# Patient Record
Sex: Female | Born: 1935 | Race: White | Hispanic: No | Smoking: Current some day smoker
Health system: Southern US, Community
[De-identification: ages and names within clinical notes are randomized; demographics above are authoritative.]

## PROBLEM LIST (undated history)

## (undated) DIAGNOSIS — I10 Essential (primary) hypertension: Secondary | ICD-10-CM

## (undated) DIAGNOSIS — R55 Syncope and collapse: Secondary | ICD-10-CM

## (undated) DIAGNOSIS — F32A Depression, unspecified: Secondary | ICD-10-CM

## (undated) DIAGNOSIS — E119 Type 2 diabetes mellitus without complications: Secondary | ICD-10-CM

## (undated) DIAGNOSIS — M1990 Unspecified osteoarthritis, unspecified site: Secondary | ICD-10-CM

## (undated) DIAGNOSIS — F329 Major depressive disorder, single episode, unspecified: Secondary | ICD-10-CM

## (undated) DIAGNOSIS — M545 Low back pain, unspecified: Secondary | ICD-10-CM

## (undated) DIAGNOSIS — R092 Respiratory arrest: Secondary | ICD-10-CM

## (undated) DIAGNOSIS — R928 Other abnormal and inconclusive findings on diagnostic imaging of breast: Secondary | ICD-10-CM

## (undated) DIAGNOSIS — G8929 Other chronic pain: Secondary | ICD-10-CM

## (undated) DIAGNOSIS — K5730 Diverticulosis of large intestine without perforation or abscess without bleeding: Secondary | ICD-10-CM

## (undated) DIAGNOSIS — I2510 Atherosclerotic heart disease of native coronary artery without angina pectoris: Secondary | ICD-10-CM

## (undated) DIAGNOSIS — Z8619 Personal history of other infectious and parasitic diseases: Secondary | ICD-10-CM

## (undated) DIAGNOSIS — K760 Fatty (change of) liver, not elsewhere classified: Secondary | ICD-10-CM

## (undated) DIAGNOSIS — E7800 Pure hypercholesterolemia, unspecified: Secondary | ICD-10-CM

## (undated) DIAGNOSIS — R569 Unspecified convulsions: Secondary | ICD-10-CM

## (undated) DIAGNOSIS — R0602 Shortness of breath: Secondary | ICD-10-CM

## (undated) DIAGNOSIS — D369 Benign neoplasm, unspecified site: Secondary | ICD-10-CM

## (undated) HISTORY — DX: Fatty (change of) liver, not elsewhere classified: K76.0

## (undated) HISTORY — PX: CARDIAC CATHETERIZATION: SHX172

## (undated) HISTORY — DX: Benign neoplasm, unspecified site: D36.9

## (undated) HISTORY — DX: Personal history of other infectious and parasitic diseases: Z86.19

## (undated) HISTORY — DX: Diverticulosis of large intestine without perforation or abscess without bleeding: K57.30

---

## 1978-06-26 HISTORY — PX: ABDOMINAL HYSTERECTOMY: SHX81

## 1978-06-26 HISTORY — PX: APPENDECTOMY: SHX54

## 1989-04-26 HISTORY — PX: ORIF HIP FRACTURE: SHX2125

## 1999-02-25 HISTORY — PX: CATARACT EXTRACTION W/ INTRAOCULAR LENS  IMPLANT, BILATERAL: SHX1307

## 2001-06-06 ENCOUNTER — Encounter: Payer: Self-pay | Admitting: Family Medicine

## 2001-06-06 ENCOUNTER — Ambulatory Visit (HOSPITAL_COMMUNITY): Admission: RE | Admit: 2001-06-06 | Discharge: 2001-06-06 | Payer: Self-pay | Admitting: Family Medicine

## 2003-05-07 ENCOUNTER — Ambulatory Visit (HOSPITAL_COMMUNITY): Admission: RE | Admit: 2003-05-07 | Discharge: 2003-05-07 | Payer: Self-pay | Admitting: Family Medicine

## 2003-06-27 DIAGNOSIS — Z8619 Personal history of other infectious and parasitic diseases: Secondary | ICD-10-CM

## 2003-06-27 HISTORY — DX: Personal history of other infectious and parasitic diseases: Z86.19

## 2003-07-13 DIAGNOSIS — D369 Benign neoplasm, unspecified site: Secondary | ICD-10-CM

## 2003-07-13 HISTORY — DX: Benign neoplasm, unspecified site: D36.9

## 2003-07-23 ENCOUNTER — Ambulatory Visit (HOSPITAL_COMMUNITY): Admission: RE | Admit: 2003-07-23 | Discharge: 2003-07-23 | Payer: Self-pay | Admitting: Internal Medicine

## 2003-07-23 HISTORY — PX: COLONOSCOPY: SHX174

## 2003-08-20 ENCOUNTER — Ambulatory Visit (HOSPITAL_COMMUNITY): Admission: RE | Admit: 2003-08-20 | Discharge: 2003-08-20 | Payer: Self-pay | Admitting: Cardiovascular Disease

## 2004-06-24 ENCOUNTER — Ambulatory Visit (HOSPITAL_COMMUNITY): Admission: RE | Admit: 2004-06-24 | Discharge: 2004-06-24 | Payer: Self-pay | Admitting: Internal Medicine

## 2004-08-19 ENCOUNTER — Ambulatory Visit (HOSPITAL_COMMUNITY): Admission: RE | Admit: 2004-08-19 | Discharge: 2004-08-19 | Payer: Self-pay | Admitting: Internal Medicine

## 2005-06-27 ENCOUNTER — Ambulatory Visit (HOSPITAL_COMMUNITY): Admission: RE | Admit: 2005-06-27 | Discharge: 2005-06-27 | Payer: Self-pay | Admitting: Internal Medicine

## 2005-08-04 ENCOUNTER — Emergency Department (HOSPITAL_COMMUNITY): Admission: EM | Admit: 2005-08-04 | Discharge: 2005-08-04 | Payer: Self-pay | Admitting: Emergency Medicine

## 2006-01-09 ENCOUNTER — Ambulatory Visit: Payer: Self-pay | Admitting: Gastroenterology

## 2006-01-21 ENCOUNTER — Emergency Department (HOSPITAL_COMMUNITY): Admission: EM | Admit: 2006-01-21 | Discharge: 2006-01-21 | Payer: Self-pay | Admitting: Emergency Medicine

## 2006-01-25 ENCOUNTER — Ambulatory Visit (HOSPITAL_COMMUNITY): Admission: RE | Admit: 2006-01-25 | Discharge: 2006-01-25 | Payer: Self-pay | Admitting: Internal Medicine

## 2006-07-03 ENCOUNTER — Ambulatory Visit (HOSPITAL_COMMUNITY): Admission: RE | Admit: 2006-07-03 | Discharge: 2006-07-03 | Payer: Self-pay | Admitting: Internal Medicine

## 2006-09-26 ENCOUNTER — Ambulatory Visit: Payer: Self-pay | Admitting: Internal Medicine

## 2006-09-26 ENCOUNTER — Ambulatory Visit (HOSPITAL_COMMUNITY): Admission: RE | Admit: 2006-09-26 | Discharge: 2006-09-26 | Payer: Self-pay | Admitting: Internal Medicine

## 2006-09-26 HISTORY — PX: COLONOSCOPY: SHX174

## 2006-10-18 ENCOUNTER — Inpatient Hospital Stay (HOSPITAL_COMMUNITY): Admission: AD | Admit: 2006-10-18 | Discharge: 2006-10-19 | Payer: Self-pay | Admitting: Cardiovascular Disease

## 2006-10-18 ENCOUNTER — Encounter: Payer: Self-pay | Admitting: Family Medicine

## 2007-03-17 ENCOUNTER — Emergency Department (HOSPITAL_COMMUNITY): Admission: EM | Admit: 2007-03-17 | Discharge: 2007-03-17 | Payer: Self-pay | Admitting: Emergency Medicine

## 2007-07-05 ENCOUNTER — Ambulatory Visit (HOSPITAL_COMMUNITY): Admission: RE | Admit: 2007-07-05 | Discharge: 2007-07-05 | Payer: Self-pay | Admitting: Internal Medicine

## 2007-07-11 ENCOUNTER — Ambulatory Visit (HOSPITAL_COMMUNITY): Admission: RE | Admit: 2007-07-11 | Discharge: 2007-07-11 | Payer: Self-pay | Admitting: Family Medicine

## 2008-01-30 ENCOUNTER — Emergency Department (HOSPITAL_COMMUNITY): Admission: EM | Admit: 2008-01-30 | Discharge: 2008-01-30 | Payer: Self-pay | Admitting: Emergency Medicine

## 2008-03-19 ENCOUNTER — Observation Stay (HOSPITAL_COMMUNITY): Admission: EM | Admit: 2008-03-19 | Discharge: 2008-03-22 | Payer: Self-pay | Admitting: Emergency Medicine

## 2008-04-18 ENCOUNTER — Emergency Department (HOSPITAL_COMMUNITY): Admission: EM | Admit: 2008-04-18 | Discharge: 2008-04-18 | Payer: Self-pay | Admitting: Emergency Medicine

## 2008-07-20 ENCOUNTER — Ambulatory Visit (HOSPITAL_COMMUNITY): Admission: RE | Admit: 2008-07-20 | Discharge: 2008-07-20 | Payer: Self-pay | Admitting: Family Medicine

## 2009-06-04 ENCOUNTER — Inpatient Hospital Stay (HOSPITAL_COMMUNITY): Admission: EM | Admit: 2009-06-04 | Discharge: 2009-06-06 | Payer: Self-pay | Admitting: Emergency Medicine

## 2009-08-27 ENCOUNTER — Ambulatory Visit (HOSPITAL_COMMUNITY): Admission: RE | Admit: 2009-08-27 | Discharge: 2009-08-27 | Payer: Self-pay | Admitting: Family Medicine

## 2010-06-02 ENCOUNTER — Emergency Department (HOSPITAL_COMMUNITY): Admission: EM | Admit: 2010-06-02 | Discharge: 2010-03-17 | Payer: Self-pay | Admitting: Emergency Medicine

## 2010-07-04 ENCOUNTER — Ambulatory Visit (HOSPITAL_COMMUNITY)
Admission: RE | Admit: 2010-07-04 | Discharge: 2010-07-04 | Payer: Self-pay | Source: Home / Self Care | Attending: Family Medicine | Admitting: Family Medicine

## 2010-07-17 ENCOUNTER — Encounter: Payer: Self-pay | Admitting: Family Medicine

## 2010-09-01 ENCOUNTER — Emergency Department (HOSPITAL_COMMUNITY)
Admission: EM | Admit: 2010-09-01 | Discharge: 2010-09-02 | Disposition: A | Discharge status: Home / Self Care | Payer: Medicare Other | Attending: Emergency Medicine | Admitting: Emergency Medicine

## 2010-09-01 DIAGNOSIS — E876 Hypokalemia: Secondary | ICD-10-CM | POA: Insufficient documentation

## 2010-09-01 DIAGNOSIS — E7800 Pure hypercholesterolemia, unspecified: Secondary | ICD-10-CM | POA: Insufficient documentation

## 2010-09-01 DIAGNOSIS — I10 Essential (primary) hypertension: Secondary | ICD-10-CM | POA: Insufficient documentation

## 2010-09-01 DIAGNOSIS — I2510 Atherosclerotic heart disease of native coronary artery without angina pectoris: Secondary | ICD-10-CM | POA: Insufficient documentation

## 2010-09-01 DIAGNOSIS — Z79899 Other long term (current) drug therapy: Secondary | ICD-10-CM | POA: Insufficient documentation

## 2010-09-01 DIAGNOSIS — G40909 Epilepsy, unspecified, not intractable, without status epilepticus: Secondary | ICD-10-CM | POA: Insufficient documentation

## 2010-09-01 DIAGNOSIS — F411 Generalized anxiety disorder: Secondary | ICD-10-CM | POA: Insufficient documentation

## 2010-09-02 LAB — URINALYSIS, ROUTINE W REFLEX MICROSCOPIC
Ketones, ur: NEGATIVE mg/dL
Nitrite: NEGATIVE
pH: 6.5 (ref 5.0–8.0)

## 2010-09-02 LAB — POCT I-STAT, CHEM 8
Calcium, Ion: 0.96 mmol/L — ABNORMAL LOW (ref 1.12–1.32)
Chloride: 91 mEq/L — ABNORMAL LOW (ref 96–112)
HCT: 45 % (ref 36.0–46.0)
Potassium: 3 mEq/L — ABNORMAL LOW (ref 3.5–5.1)
Sodium: 129 mEq/L — ABNORMAL LOW (ref 135–145)

## 2010-09-08 LAB — DIFFERENTIAL
Eosinophils Absolute: 0.2 10*3/uL (ref 0.0–0.7)
Eosinophils Relative: 3 % (ref 0–5)
Lymphocytes Relative: 31 % (ref 12–46)
Lymphs Abs: 2.6 10*3/uL (ref 0.7–4.0)
Monocytes Absolute: 0.8 10*3/uL (ref 0.1–1.0)

## 2010-09-08 LAB — URINALYSIS, ROUTINE W REFLEX MICROSCOPIC
Bilirubin Urine: NEGATIVE
Ketones, ur: NEGATIVE mg/dL
Nitrite: NEGATIVE
Protein, ur: 30 mg/dL — AB
Urobilinogen, UA: 0.2 mg/dL (ref 0.0–1.0)

## 2010-09-08 LAB — URINE CULTURE
Colony Count: NO GROWTH
Culture: NO GROWTH

## 2010-09-08 LAB — CBC
HCT: 44.1 % (ref 36.0–46.0)
Platelets: 313 10*3/uL (ref 150–400)
RDW: 13.7 % (ref 11.5–15.5)
WBC: 8.5 10*3/uL (ref 4.0–10.5)

## 2010-09-08 LAB — LACTIC ACID, PLASMA: Lactic Acid, Venous: 2.4 mmol/L — ABNORMAL HIGH (ref 0.5–2.2)

## 2010-09-08 LAB — CULTURE, BLOOD (ROUTINE X 2)
Culture: NO GROWTH
Report Status: 9272011
Report Status: 9272011

## 2010-09-08 LAB — BASIC METABOLIC PANEL
BUN: 4 mg/dL — ABNORMAL LOW (ref 6–23)
Creatinine, Ser: 0.63 mg/dL (ref 0.4–1.2)
GFR calc non Af Amer: >60 mL/min (ref >60)
Potassium: 3.3 mEq/L — ABNORMAL LOW (ref 3.5–5.1)

## 2010-09-27 LAB — HEMOGLOBIN A1C: Hgb A1c MFr Bld: 6.1 % (ref 4.6–6.1)

## 2010-09-27 LAB — RAPID URINE DRUG SCREEN, HOSP PERFORMED
Amphetamines: NOT DETECTED
Benzodiazepines: NOT DETECTED
Cocaine: NOT DETECTED
Opiates: NOT DETECTED
Tetrahydrocannabinol: NOT DETECTED

## 2010-09-27 LAB — CBC
HCT: 37.8 % (ref 36.0–46.0)
HCT: 42.6 % (ref 36.0–46.0)
MCHC: 34.4 g/dL (ref 30.0–36.0)
Platelets: 299 10*3/uL (ref 150–400)
Platelets: 338 10*3/uL (ref 150–400)
RDW: 14.6 % (ref 11.5–15.5)
RDW: 14.8 % (ref 11.5–15.5)

## 2010-09-27 LAB — COMPREHENSIVE METABOLIC PANEL
Albumin: 4.2 g/dL (ref 3.5–5.2)
BUN: 6 mg/dL (ref 6–23)
Creatinine, Ser: 0.68 mg/dL (ref 0.4–1.2)
GFR calc Af Amer: >60 mL/min (ref >60)
Potassium: 3.4 mEq/L — ABNORMAL LOW (ref 3.5–5.1)
Total Protein: 8 g/dL (ref 6.0–8.3)

## 2010-09-27 LAB — GLUCOSE, CAPILLARY
Glucose-Capillary: 108 mg/dL — ABNORMAL HIGH (ref 70–99)
Glucose-Capillary: 109 mg/dL — ABNORMAL HIGH (ref 70–99)
Glucose-Capillary: 112 mg/dL — ABNORMAL HIGH (ref 70–99)
Glucose-Capillary: 129 mg/dL — ABNORMAL HIGH (ref 70–99)

## 2010-09-27 LAB — DIFFERENTIAL
Basophils Absolute: 0 10*3/uL (ref 0.0–0.1)
Basophils Relative: 1 % (ref 0–1)
Eosinophils Absolute: 0.2 10*3/uL (ref 0.0–0.7)
Eosinophils Relative: 3 % (ref 0–5)
Lymphocytes Relative: 24 % (ref 12–46)
Lymphocytes Relative: 33 % (ref 12–46)
Monocytes Absolute: 0.8 10*3/uL (ref 0.1–1.0)
Monocytes Relative: 11 % (ref 3–12)
Neutro Abs: 4.6 10*3/uL (ref 1.7–7.7)

## 2010-09-27 LAB — URINALYSIS, ROUTINE W REFLEX MICROSCOPIC
Nitrite: NEGATIVE
Protein, ur: 30 mg/dL — AB
Urobilinogen, UA: 1 mg/dL (ref 0.0–1.0)

## 2010-09-27 LAB — MAGNESIUM: Magnesium: 2 mg/dL (ref 1.5–2.5)

## 2010-09-27 LAB — BLOOD GAS, ARTERIAL
Acid-Base Excess: 4.3 mmol/L — ABNORMAL HIGH (ref 0.0–2.0)
Bicarbonate: 30.6 mEq/L — ABNORMAL HIGH (ref 20.0–24.0)
TCO2: 24.9 mmol/L (ref 0–100)
TCO2: 26.5 mmol/L (ref 0–100)
pCO2 arterial: 39.3 mmHg (ref 35.0–45.0)
pCO2 arterial: 41.7 mmHg (ref 35.0–45.0)
pH, Arterial: 7.479 — ABNORMAL HIGH (ref 7.350–7.400)
pO2, Arterial: 79.8 mmHg — ABNORMAL LOW (ref 80.0–100.0)

## 2010-09-27 LAB — BASIC METABOLIC PANEL
BUN: 4 mg/dL — ABNORMAL LOW (ref 6–23)
GFR calc non Af Amer: >60 mL/min (ref >60)
Glucose, Bld: 87 mg/dL (ref 70–99)
Potassium: 3 mEq/L — ABNORMAL LOW (ref 3.5–5.1)

## 2010-09-27 LAB — POTASSIUM: Potassium: 3.7 mEq/L (ref 3.5–5.1)

## 2010-09-27 LAB — URINE MICROSCOPIC-ADD ON

## 2010-11-08 NOTE — H&P (Signed)
[name], Maldonado NO.:  [id number]   MEDICAL RECORD NO.:  [id number]          PATIENT TYPE:  OBV   LOCATION:  A341                          FACILITY:  [facility]   PHYSICIAN:  Osvaldo Shipper, MD     DATE OF BIRTH:  1936-03-01   DATE OF ADMISSION:  03/19/2008  DATE OF DISCHARGE:  LH                              HISTORY & PHYSICAL   PRIMARY CARE PHYSICIAN:  Kirk Ruths, M.D.   CARDIOLOGIST:  Nicki Guadalajara, M.D. with [facility].   ADMISSION DIAGNOSES:  1. Chest pain possibly related to poorly controlled blood pressure.  2. Uncontrolled hypertension.  3. Possible noncompliance.  4. Type 2 diabetes.  5. Dyslipidemia.  6. Seizure disorder.  7. Left renal artery stenosis.   CHIEF COMPLAINT:  Chest pain, nausea and vomiting this afternoon.   HISTORY OF PRESENT ILLNESS:  The patient is a 75 year old Caucasian  female who has hypertension, type 2 diabetes, dyslipidemia who  unfortunately continues to smoke quite heavily who was in her usual  state of health until today when she started developing chest pain  located in the retrosternal area as well as in the left side of her  chest.  She is a very poor historian and is unable to describe her  symptoms appropriately.  She mostly describes this pain as a deep pain  possibly dull.  On and off.  She did have two episodes of emesis this  afternoon.  This evening she started developing chest pain about 6:30  and at that time she was brought into the ED.  She developed some  shortness of breath, no palpitations.  She also had a headache.  No  syncopal episode was present.  She does have a chronic cough.  No  relation of the pain to breathing or to cough.  She mentions that the  pain was 6/10 in intensity and is currently almost relieved.   Overall, not an adequate history from this individual.   MEDICATIONS AT HOME:  1. Actonel 5 mg once a day.  2. Hydrochlorothiazide 25 mg once a  day.  3. Metformin 250 mg b.i.d.  4. Alprazolam 0.5 mg at bedtime.  5. Metoprolol 50 mg b.i.d.  6. Colace 250 mg once a day as needed.  7. Meloxicam 7.5 mg once a day.  8. Simvastatin 40 mg daily.  9. Centrum Silver one tablet daily.  10.Folic acid 1 tablet daily.  11.Aspirin 81 mg daily.  12.Zyrtec as needed.  13.OsCal b.i.d.   ALLERGIES:  PENICILLIN.   PAST MEDICAL HISTORY:  Positive for:  1. Dyslipidemia.  2. Hypertension.  3. Seizure disorder.   She has never had an MI and never had any stent placement.  She did have  a cardiac catheterization 1-1/2 years ago which did show 50% ostial  narrowing in the first septal perforator of the LAD, but medical  management was recommended.  EF was normal at that time.  She mentions  that she had an episode of chest pain back in August as well and at that  time she was found  to be apparently hypertensive on presentation.  Blood  pressure was 211/90 at that time.   She tells me that she is supposed to undergo some kind of evaluation at  the [facility] in [place] for her renal artery  stenosis.  She also mentions that she has been noncompliant with her  medications in the past.  However, for the past 2 months her sister has  been administering these medications and the patient tells me that she  does take them on a daily basis.   PAST SURGICAL HISTORY:  1. Goiter surgery.  2. Hysterectomy.  3. Surgery for hip fractures and leg trauma.   SOCIAL HISTORY:  She lives alone in [place].  She smokes 2 packs of  cigarettes on a daily basis.  No alcohol use.  No illicit drug use.  She  is fairly independent with her daily activities.   FAMILY HISTORY:  Noncontributory at this time.   REVIEW OF SYSTEMS:  A 10-point review of systems was negative except as  mentioned in the HPI.   PHYSICAL EXAMINATION:  VITAL SIGNS:  Temperature 97.9, blood pressure  when she came in was 213/101, heart rate 70, respiratory rate  20,  saturations 100% on oxygen.  Blood pressure did improve to 128/60 with  intervention.  GENERAL:  This is an elderly white female in no distress at this time.  HEENT:  No pallor and no icterus.  Oral mucous membrane is moist.  No  oral lesions are noted.  NECK:  Soft and supple.  No thyromegaly is appreciated.  CARDIOVASCULAR:  S1 and S2 is normal and regular.  No murmurs  appreciated.  No S3 and S4.  No rubs and no bruits.  LUNGS:  Clear to auscultation bilaterally.  No wheezing, rales, or  rhonchi.  ABDOMEN:  Soft, nontender, and nondistended.  Bowel sounds are present.  No mass or organomegaly is appreciated.  EXTREMITIES:  Do not show any edema.  Peripheral pulses are palpable.  MUSCULOSKELETAL:  Unremarkable.  NEUROLOGY:  She is alert and oriented x3.  No focal neurological  deficits are present.   LABORATORY DATA:  Her CBC is unremarkable.  Her BMET shows a sodium of  128, potassium 3.2, chloride 85, bicarb 35, glucose 98, BUN 6,  creatinine 0.6.  Cardiac markers negative x2.  BNP is 33.  UA showed a  low specific gravity, otherwise unremarkable.   She had a chest x-ray which showed cardiomegaly and pulmonary vascular  prominence without anything acute, possible nodularity in the anterior  aspect of the first right rib was noted.  CT of the head was done which  did not show any significant findings other than mild nonspecific white  matter changes and partial opacification of the right sphenoid sinus air  cells was also noted.   EKG was done which shows sinus rhythm with normal axis.  There is  evidence for right bundle branch block.  Nonspecific T wave changes are  noted.  No acute ST changes are noted compared to an older EKG.  No  significant changes appreciated.   ASSESSMENT:  This is a 75 year old Caucasian female who has a history of  hypertension which is not well controlled possibly from noncompliance  who presents today with chest pain and was found to be  extremely  hypertensive on admission to the emergency department.  Chest pain is  possibly a result of the high blood pressure.  With a not so significant  cardiac catheterization a year  and a half ago, I would not suspect  coronary artery disease as a possibility here.  The pain is also fairly  atypical for pulmonary embolism.  She has had CT scans of her chest in  the past with similar pains which have been negative for pulmonary  embolism.   PLAN:  1. Chest pain.  We will observe her in the hospital and rule her out      for acute coronary syndrome.  Consult [facility] in      the morning to see what further tests they want to consider.      Continue with the aspirin and beta-blocker at this time.  Deep      venous thrombosis prophylaxis will be initiated.  EKGs will be      repeated and lipid profile will be checked.  2. Hypertension, poorly controlled.  We will resume all of her      antihypertensive agents.  Continue with the nitro paste for now.      The plan has been emphasized to the patient.  Depending on how her      blood pressures do overnight, additional medications may be      considered.  We will also hope cardiology will be able to recommend      something in this case.  I will hold off on the hydrochlorothiazide      because of the hyponatremia and hypokalemia for now. Renal artery      stenosis may be contributing as well. Outpatient work-up for this      is planned.  3. Hyponatremia and hypokalemia.  She had a couple of episodes of      emesis.  She is on diuretics.  These could be some of the reasons      for her electrolyte imbalances.  We will replace her potassium,      give her gentle normal saline and recheck these labs tomorrow.  4. Type 2 diabetes.  Continue Metformin.  Check CBGs q.a.c. and      nightly.  Put her on a sliding scale and check an HB A1C.  5. History of seizure disorder.  She is on Keppra.  The dose is      unknown.  I have asked  the family to call in with the proper dosage      so that we can restart this medication in the hospital.  6. She has a history of osteoporosis which is stable.  7. Deep venous thrombosis prophylaxis will be initiated.  8. Further management decisions will depend on results of further      testing and the patient's response to treatment.      Osvaldo Shipper, MD  Electronically Signed     GK/MEDQ  D:  03/19/2008  T:  03/20/2008  Job:  [id number]   cc:   Dani Gobble, MD  Fax: [phone]   Kirk Ruths, M.D.  Fax: [phone]

## 2010-11-08 NOTE — Group Therapy Note (Signed)
NAMEBRITNY, Shannon Maldonado              ACCOUNT NO.:  [id number]   MEDICAL RECORD NO.:  [id number]          PATIENT TYPE:  OBV   LOCATION:  A341                          FACILITY:  [facility]   PHYSICIAN:  Margaretmary Dys, M.D.DATE OF BIRTH:  Jun 19, 1936   DATE OF PROCEDURE:  03/21/2008  DATE OF DISCHARGE:                                 PROGRESS NOTE   SUBJECTIVE:  The patient feels much better.  She said the chest pain is  gone, so is nausea and vomiting.   The patient is looking forward to ambulating in the hallway today.  She  denies any abdominal pain.  She denies any diarrhea.   OBJECTIVE:  GENERAL:  Conscious, alert, comfortable, pleasant, not in  acute distress.  VITAL SIGNS:  Blood pressure is 132/84, pulse of 66, respirations 20,  temperature 97.9 degrees Fahrenheit, oxygen saturation was 93% on room  air.  HEENT:  Normocephalic, atraumatic.  Oral mucosa was dry.  No exudates  were noted.  NECK:  Supple.  No JVD, lymphadenopathy.  LUNGS:  Clear clinically.  Good air entry bilaterally.  HEART:  S1 and S2 regular.  No S3, S4,  gallops or rubs.  ABDOMEN:  Soft, nontender.  Bowel sounds positive.  No masses palpable.  EXTREMITIES:  Trace pitting pedal edema with no calf induration or  tenderness.  CNS:  Grossly intact.  No focal neurological deficits.   LABORATORY DATA/DIAGNOSTIC:  White blood count 6.4, hemoglobin of 13.4,  hematocrit 38.5, platelet count 343,000 with no left shift.  Sodium 135,  potassium 4.4, chloride of 99, CO2 was 33, glucose 91, BUN of 6,  creatinine was 0.64, calcium is 9.6.   ASSESSMENT/PLAN:  1. Chest pain, rule out myocardial infarction.  2. Poorly controlled hypertension with severe hypertension on      presentation to the emergency room.  3. Type 2 diabetes.  4. History of left renal artery stenosis, awaiting work-up.  5. Seizure disorder.  6. Dyslipidemia.  7. Noncompliance.   1. The patient has ruled out for myocardial infarction.  The  patient      remains on aspirin and beta-blocker.  I think we can discontinue      her Nitrol paste.  Twelve-lead EKG remains unchanged.  2. Hypertension.  The patient's blood pressure is actually improved on      resumption of her home medications.  I agree that it is entirely      possible that this is secondary to poor compliance.  The patient      also has history of left renal artery stenosis and this may have      been accounting for some of her blood pressure problems.  3. Hyponatremia and hypokalemia, likely secondary to be emesis and      diuretic therapy.  Patient is currently on gentle saline infusion      and her electrolytes are corrected.  4. Type 2 diabetes.  Continue current oral hypoglycemic agents with      metformin and also sliding scale.  5. History of seizure disorder.  Continue the patient on Keppra at  this time.   DISPOSITION:  Overall the patient remains fairly well with negative  cardiac enzymes.  Her chest pain may have been entirely related to her  poorly controlled hypertension, although she may also have some baseline  angina.  Will evaluate the patient and possibly obtain cardiology input  on Monday if the patient has a recurrence of chest pain.      Margaretmary Dys, M.D.  Electronically Signed     AM/MEDQ  D:  03/21/2008  T:  03/21/2008  Job:  [id number]
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
## 2010-11-11 NOTE — Op Note (Signed)
NAME:  Shannon Maldonado, Shannon Maldonado                        ACCOUNT NO.:  [id number]   MEDICAL RECORD NO.:  [id number]                   PATIENT TYPE:  AMB   LOCATION:  DAY                                  FACILITY:  [facility]   PHYSICIAN:  Shannon Maldonado, M.D.              DATE OF BIRTH:  1935-08-09   DATE OF PROCEDURE:  07/23/2003  DATE OF DISCHARGE:                                 OPERATIVE REPORT   PROCEDURE:  Colonoscopy with stool sampling, snare polypectomy with biopsy.   ENDOSCOPIST:  Shannon Maldonado, M.D.   INDICATIONS FOR PROCEDURE:  The patient is a 75 year old lady with a 36-month  history of 13-month history of postprandial abdominal cramps and diarrhea.  She has never had a colonoscopy.  No family history of colorectal neoplasia.  Colonoscopy is now being done in part for screening and in part to further  evaluate her altered bowel habits.  This approach has been discussed with  the patient at the length.  The potential risks, benefits, and alternatives  have been reviewed; questions answered.  She is agreeable.  Please see my  handwritten H&P for more information.   PROCEDURE NOTE:  O2 saturation, blood pressure, pulse and respirations were  monitored throughout the entire procedure.  Conscious sedation: Versed 3 mg  IV, Demerol 75 mg IV in divided doses.   INSTRUMENT:  Olympus video chip pediatric colonoscope.   FINDINGS:  A digital rectal exam reveals no abnormalities.   ENDOSCOPIC FINDINGS:  The prep was adequate.   RECTUM:  Examination of the rectal mucosa including a retroflex view of the  anal verge revealed no abnormalities.   COLON:  The colonic mucosa was surveyed from the rectosigmoid junction  through the left transverse and right colon to the area of the appendiceal  orifice, ileocecal valve, and cecum.  These structures were well seen and  photographed for the record.   From this level the scope was slowly withdrawn.  All previously mentioned  mucosal surfaces  were again seen. Stool residue was suctioned for  microbiology studies.  There was a 0.75 cm pedunculated polyp in the hepatic  flexure which was removed with snare cautery and recovered through the  scope.  There was a second adjacent 5 mg polyp which was cold snared and  recovered. There was also a 5 mm polyp at 35 cm which was cold snared and  recovered.  Two biopsies of the sigmoid colon were taken to evaluate further  for microscopic colitis.   The patient tolerated the procedure well and was reacted in endoscopy.   IMPRESSION:  1. Normal rectum.  2. Left-sided diverticula.  3. Polyps in the hepatic flexure and sigmoid at 35 cm resected as described     above.  4. Biopsies of the left colon to rule out microscopic colitis.   RECOMMENDATIONS:  1. No aspirin or arthritis medications for next 10 days.  2. Follow up  on path.  3. Diverticulosis literature provided to Shannon Maldonado.  4. Further recommendations to follow.      ___________________________________________                                            Shannon Maldonado, M.D.   RMR/MEDQ  D:  07/23/2003  T:  07/23/2003  Job:  [id number]   cc:   Shannon Maldonado, M.D.  [address] [place]urell Josephs. [place]  [place]  [place] [address]  Fax: [phone]   Shannon Maldonado, M.D.  [address]  [place]  [place] [address]  Fax: [phone]

## 2010-11-11 NOTE — Cardiovascular Report (Signed)
NAMEKAILEEN, BRONKEMA NO.:  [id number]   MEDICAL RECORD NO.:  [id number]          PATIENT TYPE:  INP   LOCATION:  2013                         FACILITY:  [facility]   PHYSICIAN:  Nicki Guadalajara, M.D.     DATE OF BIRTH:  14-Dec-1935   DATE OF PROCEDURE:  DATE OF DISCHARGE:                            CARDIAC CATHETERIZATION   INDICATIONS:  Ms. Grisell Bissette is a 75 year old female with a long  history of tobacco use for over 50 years.  She has a history of  hyperlipidemia.  Over 20 years ago, apparent cardiac catheterization  revealed normal coronary arteries.  She had recently developed episodes  of chest pain and also left shoulder discomfort.  She was seen at Dr.  Edison Simon office yesterday and was given nitroglycerin for her chest  pain.  She ultimately was transported to [facility] where she  was admitted.  Cardiac enzymes were negative.  Definitive cardiac  catheterization was recommended.   PROCEDURE:  After premedication with Valium 2 mg intravenously and  fentanyl 25 mg, the patient prepped and draped in the usual fashion.  Her right femoral artery was punctured anteriorly and a 5-French sheath  was inserted.  Diagnostic catheterization was done utilizing 5-French  Judkins for left and right coronary catheters.  A 5-French pigtail  catheter was used for left ventriculography as well as distal  aortography.  Hemostasis was obtained by direct manual pressure.  The  patient tolerated the procedure well.   Hemodynamic data:  Central aortic pressure was 135/71.  Left ventricular  pressure was 135/14.   Angiographic data:  There was evidence for mild coronary calcification.  The left main coronary artery had an upward takeoff and trifurcated into  an LAD, a ramus intermediate vessel and the left circumflex vessel.  Left main was normal.   The LAD gave rise to several diagonal vessels, proximal moderate-sized  first septal perforating artery and several  additional small septal  perforating arteries.  There is mild 50% ostial narrowing involving the  proximal septal perforating artery.   The ramus intermediate vessel was angiographically normal.   The circumflex vessel had mild 20% proximal narrowing and gave rise to  an additional marginal vessel.   The right coronary artery was a large caliber angiographically normal  dominant vessel.   RAO ventriculography revealed hyperdynamic LV function.  Ejection  fraction is at least 65%.  There were no focal segmental wall motion  abnormalities.  There was no mitral regurgitation.   Distal aortography revealed mildly tortuous aorta without significant  stenoses.  There was no evidence for renal artery stenosis.   IMPRESSION:  1. Normal to hyperdynamic left ventricular (LV) function.  2. Mild coronary calcification with 50% ostial narrowing involving the      first septal perforator of the left anterior descending (LAD) and      20% narrowing of the proximal circumflex.   RECOMMENDATIONS:  Medical therapy.  Suspect noncardiac chest pain.           ______________________________  Nicki Guadalajara, M.D.     TK/MEDQ  D:  10/19/2006  T:  10/19/2006  Job:  [id number]   cc:   Kirk Ruths, M.D.  Nicki Guadalajara, M.D.

## 2010-11-11 NOTE — Op Note (Signed)
Shannon Maldonado, DELROSARIO              ACCOUNT NO.:  [id number]   MEDICAL RECORD NO.:  [id number]          PATIENT TYPE:  AMB   LOCATION:  DAY                           FACILITY:  [facility]   PHYSICIAN:  R. Roetta Sessions, M.D. 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  She is here for surveillance.  She is not  having any other lower GI tract symptoms aside from constipation.  Colonoscopy is now being done.  This approach has been discussed with  the patient at length.  The potential risks, benefits, and alternatives  have been reviewed, questions answered.  She is agreeable.  Please see  the documentation in the medical record.   PROCEDURE NOTE:  O2 saturation, blood pressure, pulse, and respirations  are monitor throughout the entire procedure.   CONSCIOUS SEDATION:  Versed 3 mg IV Demerol 75 mg IV in divided doses.   INSTRUMENT:  Pentax video chip system.   FINDINGS:  A digital rectal exam revealed no abnormalities.  Endoscopic  findings:  The prep was adequate.  Examination of the colonic mucosa was  undertaken.  The scope was advanced from the rectosigmoid junction  through the left transverse, right colon, to the area of the appendiceal  orifice, ileocecal valve, and cecum.  These structures were well seen  and photographed for the record.  From this level, the scope was slowly  withdrawn.  All previously mentioned mucosal surfaces were, again, seen.  The patient was noted to have left-sided diverticula.  The remainder of  the colonic mucosa appeared normal.  The scope was pulled down into the  rectum, where a thorough examination of the rectal mucosa, including a  retroflexed view of the anal verge, demonstrated no abnormalities.   The  patient tolerated the procedure well; and was reacted in endoscopy.   IMPRESSION:  1. Normal rectum.  2. Left-sided diverticula.  3. The remainder of the colonic mucosa appeared normal.   RECOMMENDATIONS:  1. Diverticulosis literature provided to Ms. [place].  2. She should take a daily fiber supplement.  Would also recommend 17      grams of MiraLax at bedtime p.r.n. constipation.  3. Repeat colonoscopy in 5 years.      Jonathon Bellows, M.D.  Electronically Signed     RMR/MEDQ  D:  09/26/2006  T:  09/26/2006  Job:  [id number]   cc:   Madelin Rear. Sherwood Gambler, MD  Fax: [phone]

## 2011-03-24 LAB — DIFFERENTIAL
Eosinophils Absolute: 0.2
Lymphs Abs: 1.9
Monocytes Relative: 8
Neutrophils Relative %: 61

## 2011-03-24 LAB — BASIC METABOLIC PANEL
Chloride: 101
Creatinine, Ser: 0.7
GFR calc Af Amer: >60
Potassium: 3.5

## 2011-03-24 LAB — CBC
HCT: 42.2
MCV: 89
RBC: 4.75
WBC: 7

## 2011-03-24 LAB — URINALYSIS, ROUTINE W REFLEX MICROSCOPIC
Bilirubin Urine: NEGATIVE
Glucose, UA: NEGATIVE
Specific Gravity, Urine: 1.015
pH: 6.5

## 2011-03-24 LAB — URINE MICROSCOPIC-ADD ON

## 2011-03-27 LAB — DIFFERENTIAL
Basophils Absolute: 0
Basophils Absolute: 0
Basophils Relative: 0
Basophils Relative: 1
Eosinophils Absolute: 0.2
Eosinophils Absolute: 0.4
Eosinophils Relative: 3
Eosinophils Relative: 3
Eosinophils Relative: 3
Eosinophils Relative: 7 — ABNORMAL HIGH
Lymphocytes Relative: 32
Lymphocytes Relative: 39
Lymphs Abs: 3
Monocytes Absolute: 0.6
Monocytes Absolute: 0.7
Monocytes Absolute: 0.9
Monocytes Relative: 13 — ABNORMAL HIGH
Monocytes Relative: 8
Neutro Abs: 2.8
Neutro Abs: 4.4
Neutro Abs: 5.4
Neutrophils Relative %: 38 — ABNORMAL LOW

## 2011-03-27 LAB — GLUCOSE, CAPILLARY
Glucose-Capillary: 105 — ABNORMAL HIGH
Glucose-Capillary: 106 — ABNORMAL HIGH
Glucose-Capillary: 93
Glucose-Capillary: 95
Glucose-Capillary: 96

## 2011-03-27 LAB — CBC
HCT: 38.8
HCT: 39.8
HCT: 42.2
Hemoglobin: 14.5
MCHC: 34.3
MCHC: 34.5
MCV: 88.8
MCV: 88.9
MCV: 89.9
Platelets: 343
Platelets: 357
RBC: 4.43
RDW: 13.9
RDW: 14.1
RDW: 14.2
WBC: 9.5

## 2011-03-27 LAB — COMPREHENSIVE METABOLIC PANEL
AST: 24
Albumin: 3.6
BUN: 9
Chloride: 87 — ABNORMAL LOW
Creatinine, Ser: 0.72
GFR calc Af Amer: >60
Total Bilirubin: 0.7
Total Protein: 6.3

## 2011-03-27 LAB — BASIC METABOLIC PANEL
BUN: 6
BUN: 7
CO2: 29
CO2: 35 — ABNORMAL HIGH
Calcium: 9.6
Calcium: 9.7
Calcium: 9.8
Chloride: 100
Chloride: 85 — ABNORMAL LOW
Creatinine, Ser: 0.64
Creatinine, Ser: 0.66
GFR calc Af Amer: >60
GFR calc non Af Amer: >60
GFR calc non Af Amer: >60
Glucose, Bld: 111 — ABNORMAL HIGH
Glucose, Bld: 91
Glucose, Bld: 98
Sodium: 128 — ABNORMAL LOW
Sodium: 130 — ABNORMAL LOW

## 2011-03-27 LAB — HEPATIC FUNCTION PANEL
Alkaline Phosphatase: 53
Bilirubin, Direct: 0.1
Indirect Bilirubin: 0.5
Total Bilirubin: 0.6

## 2011-03-27 LAB — URINALYSIS, ROUTINE W REFLEX MICROSCOPIC
Bilirubin Urine: NEGATIVE
Ketones, ur: NEGATIVE
Nitrite: NEGATIVE
Protein, ur: NEGATIVE

## 2011-03-27 LAB — CARDIAC PANEL(CRET KIN+CKTOT+MB+TROPI)
CK, MB: 1.2
CK, MB: 1.4
Relative Index: INVALID
Relative Index: INVALID
Troponin I: 0.02
Troponin I: 0.02

## 2011-03-27 LAB — LIPASE, BLOOD: Lipase: 51

## 2011-03-27 LAB — MAGNESIUM: Magnesium: 1.8

## 2011-03-27 LAB — POCT CARDIAC MARKERS
Myoglobin, poc: 94.9
Troponin i, poc: <0.05

## 2011-03-27 LAB — LIPID PANEL
Triglycerides: 167 — ABNORMAL HIGH
VLDL: 33

## 2011-09-05 ENCOUNTER — Other Ambulatory Visit (HOSPITAL_COMMUNITY): Payer: Self-pay | Admitting: Family Medicine

## 2011-09-05 DIAGNOSIS — Z139 Encounter for screening, unspecified: Secondary | ICD-10-CM

## 2011-09-07 ENCOUNTER — Encounter: Payer: Self-pay | Admitting: Internal Medicine

## 2011-09-08 ENCOUNTER — Ambulatory Visit (HOSPITAL_COMMUNITY)
Admission: RE | Admit: 2011-09-08 | Discharge: 2011-09-08 | Disposition: A | Discharge status: Home / Self Care | Payer: Medicare Other | Source: Ambulatory Visit | Attending: Family Medicine | Admitting: Family Medicine

## 2011-09-08 DIAGNOSIS — Z139 Encounter for screening, unspecified: Secondary | ICD-10-CM

## 2011-09-08 DIAGNOSIS — Z13820 Encounter for screening for osteoporosis: Secondary | ICD-10-CM | POA: Insufficient documentation

## 2011-09-08 DIAGNOSIS — Z780 Asymptomatic menopausal state: Secondary | ICD-10-CM | POA: Insufficient documentation

## 2011-09-08 DIAGNOSIS — Z1231 Encounter for screening mammogram for malignant neoplasm of breast: Secondary | ICD-10-CM | POA: Insufficient documentation

## 2011-09-08 DIAGNOSIS — M899 Disorder of bone, unspecified: Secondary | ICD-10-CM | POA: Insufficient documentation

## 2011-09-08 DIAGNOSIS — F17200 Nicotine dependence, unspecified, uncomplicated: Secondary | ICD-10-CM | POA: Insufficient documentation

## 2011-10-25 ENCOUNTER — Encounter (HOSPITAL_COMMUNITY): Payer: Self-pay | Admitting: *Deleted

## 2011-10-25 ENCOUNTER — Emergency Department (HOSPITAL_COMMUNITY)
Admission: EM | Admit: 2011-10-25 | Discharge: 2011-10-25 | Disposition: A | Discharge status: Home / Self Care | Payer: Medicare Other | Attending: Emergency Medicine | Admitting: Emergency Medicine

## 2011-10-25 ENCOUNTER — Emergency Department (HOSPITAL_COMMUNITY): Payer: Medicare Other

## 2011-10-25 DIAGNOSIS — E789 Disorder of lipoprotein metabolism, unspecified: Secondary | ICD-10-CM | POA: Insufficient documentation

## 2011-10-25 DIAGNOSIS — I2510 Atherosclerotic heart disease of native coronary artery without angina pectoris: Secondary | ICD-10-CM | POA: Insufficient documentation

## 2011-10-25 DIAGNOSIS — Z79899 Other long term (current) drug therapy: Secondary | ICD-10-CM | POA: Insufficient documentation

## 2011-10-25 DIAGNOSIS — I10 Essential (primary) hypertension: Secondary | ICD-10-CM | POA: Insufficient documentation

## 2011-10-25 DIAGNOSIS — M25519 Pain in unspecified shoulder: Secondary | ICD-10-CM | POA: Insufficient documentation

## 2011-10-25 DIAGNOSIS — M79609 Pain in unspecified limb: Secondary | ICD-10-CM | POA: Insufficient documentation

## 2011-10-25 DIAGNOSIS — E119 Type 2 diabetes mellitus without complications: Secondary | ICD-10-CM | POA: Insufficient documentation

## 2011-10-25 DIAGNOSIS — M25512 Pain in left shoulder: Secondary | ICD-10-CM

## 2011-10-25 HISTORY — DX: Unspecified convulsions: R56.9

## 2011-10-25 HISTORY — DX: Essential (primary) hypertension: I10

## 2011-10-25 HISTORY — DX: Atherosclerotic heart disease of native coronary artery without angina pectoris: I25.10

## 2011-10-25 HISTORY — DX: Pure hypercholesterolemia, unspecified: E78.00

## 2011-10-25 MED ORDER — HYDROMORPHONE HCL PF 1 MG/ML IJ SOLN
1.0000 mg | Freq: Once | INTRAMUSCULAR | Status: AC
  Administered 2011-10-25: 1 mg via INTRAMUSCULAR

## 2011-10-25 MED ORDER — HYDROCODONE-ACETAMINOPHEN 5-325 MG PO TABS: 1.0000 | ORAL_TABLET | Freq: Four times a day (QID) | ORAL | Status: AC | PRN

## 2011-10-25 MED ORDER — HYDROMORPHONE HCL PF 1 MG/ML IJ SOLN
INTRAMUSCULAR | Status: AC
  Administered 2011-10-25: 1 mg via INTRAMUSCULAR
  Filled 2011-10-25: qty 1

## 2011-10-25 MED ORDER — DIAZEPAM 5 MG PO TABS
5.0000 mg | ORAL_TABLET | Freq: Once | ORAL | Status: AC
  Administered 2011-10-25: 5 mg via ORAL
  Filled 2011-10-25: qty 1

## 2011-10-25 NOTE — ED Notes (Addendum)
Sling applied to LUE.  Reports decreased pain after application.

## 2011-10-25 NOTE — ED Notes (Signed)
Left in c/o son for transport home; alert, in no distress; instructions/prescriptions reviewed and f/u information provided.  Verbalizes understanding.

## 2011-10-25 NOTE — ED Notes (Signed)
C/o LUE pain x 2 weeks; states onset of pain with movement that starts in shoulder and radiates down LUE.  Denies injury.

## 2011-10-25 NOTE — ED Provider Notes (Signed)
History     CSN: [id number]  Arrival date & time 10/25/11  0127   First MD Initiated Contact with Patient 10/25/11 0153      Chief Complaint  Patient presents with  . Arm Pain    LUE    (Consider location/radiation/quality/duration/timing/severity/associated sxs/prior treatment) The history is provided by the patient.   left shoulder pain. No injury. Has history of bursitis requiring injections and symptoms feel the same. Onset about 2 weeks ago. Pain worse with movement. Pain radiates from shoulder down arm. No numbness or tingling. No neck pain. No chest pain or shortness of breath. Patient has been taking ibuprofen at home with minimal and intermittent relief. She is followed by primary care physician and cardiology. She does not currently have an orthopedic surgeon. No fevers. No rash or redness.  Past Medical History  Diagnosis Date  . Seizures   . Hypertension   . High cholesterol   . Coronary artery disease   . Diabetes mellitus     Past Surgical History  Procedure Date  . Abdominal hysterectomy     No family history on file.  History  Substance Use Topics  . Smoking status: Never Smoker   . Smokeless tobacco: Not on file  . Alcohol Use: No    OB History    Grav Para Term Preterm Abortions TAB SAB Ect Mult Living                  Review of Systems  Constitutional: Negative for fever and chills.  HENT: Negative for neck pain and neck stiffness.   Eyes: Negative for pain.  Respiratory: Negative for shortness of breath.   Cardiovascular: Negative for chest pain and palpitations.  Gastrointestinal: Negative for abdominal pain.  Genitourinary: Negative for dysuria.  Musculoskeletal: Negative for back pain and joint swelling.  Skin: Negative for rash.  Neurological: Negative for headaches.  All other systems reviewed and are negative.    Allergies  Penicillins  Home Medications   Current Outpatient Rx  Name Route Sig Dispense Refill  . ASPIRIN 81  MG PO TABS Oral Take 81 mg by mouth daily.    Marland Kitchen CETIRIZINE HCL 10 MG PO CHEW Oral Chew 10 mg by mouth daily.    Marland Kitchen FOLIC ACID 400 MCG PO TABS Oral Take 400 mcg by mouth daily.    Marland Kitchen LEVETIRACETAM 500 MG PO TABS Oral Take 500 mg by mouth 2 (two) times daily.    Marland Kitchen METFORMIN HCL 500 MG PO TABS Oral Take 250 mg by mouth 2 (two) times daily with a meal.    . METOPROLOL TARTRATE 50 MG PO TABS Oral Take 50 mg by mouth 2 (two) times daily.    Marland Kitchen POTASSIUM GLUCONATE 595 MG PO CAPS Oral Take 595 mg by mouth 2 (two) times daily.    Marland Kitchen SIMVASTATIN 40 MG PO TABS Oral Take 40 mg by mouth every evening.      BP 197/88  Pulse 83  Temp(Src) 97.8 F (36.6 C) (Oral)  Resp 20  Ht 5\' 6"  (1.676 m)  Wt 180 lb (81.647 kg)  BMI 29.05 kg/m2  SpO2 95%  Physical Exam  Constitutional: She is oriented to person, place, and time. She appears well-developed and well-nourished.  HENT:  Head: Normocephalic and atraumatic.  Eyes: Conjunctivae and EOM are normal. Pupils are equal, round, and reactive to light.  Neck: Trachea normal. Neck supple. No thyromegaly present.  Cardiovascular: Normal rate, regular rhythm, S1 normal, S2 normal and normal  pulses.     No systolic murmur is present   No diastolic murmur is present  Pulses:      Radial pulses are 2+ on the right side, and 2+ on the left side.  Pulmonary/Chest: Effort normal and breath sounds normal. She has no wheezes. She has no rhonchi. She has no rales. She exhibits no tenderness.  Abdominal: Soft. Normal appearance and bowel sounds are normal. There is no tenderness. There is no CVA tenderness and negative Murphy's sign.  Musculoskeletal:       Left upper extremity tender over anterior shoulder without erythema, swelling or increased warmth to touch. Decreased range of motion at shoulder secondary to pain. Nontender over elbow, wrist, hand and clavicle. Distal pulses, grips and sensorium to light touch intact.  Neurological: She is alert and oriented to person,  place, and time. She has normal strength. No cranial nerve deficit or sensory deficit. GCS eye subscore is 4. GCS verbal subscore is 5. GCS motor subscore is 6.  Skin: Skin is warm and dry. No rash noted. She is not diaphoretic.  Psychiatric: Her speech is normal.       Cooperative and appropriate    ED Course  Procedures (including critical care time)  Labs Reviewed - No data to display Dg Cervical Spine Complete  10/25/2011  *RADIOLOGY REPORT*  Clinical Data: Arm pain  CERVICAL SPINE - COMPLETE 4+ VIEW  Comparison: 06/04/2009 head CT  Findings: Diffuse osteopenia.  Multilevel degenerative changes, most pronounced at C5-6. C5-6 neural foraminal narrowing bilaterally. Minimal height loss and sclerosis at C5.  C1-2 articulation maintained. No acute fracture or dislocation identified.  Upper lungs appear clear.  IMPRESSION: Osteopenia.  Multilevel degenerative changes. Minimal height loss at C5 is age indeterminate. Correlate with point tenderness. Otherwise, no definite acute osseous abnormality.  Original Report Authenticated By: Waneta Martins, M.D.    Date: 10/25/2011  Rate: 81  Rhythm: normal sinus rhythm  QRS Axis: normal  Intervals: normal  ST/T Wave abnormalities: nonspecific ST/T changes  Conduction Disutrbances:right bundle branch block  Narrative Interpretation:   Old EKG Reviewed: unchanged  IM Dilaudid/ Pain medications provided. Imaging of the cervical spine obtained and reviewed as above. No midline cervical tenderness. No shoulder deformity or indication for shoulder films. No evidence of septic joint by exam  MDM   Shoulder pain the symptoms that do not suggest ACS, likely arthritis versus bursitis with history of same. Symptoms improved in the ED. Sling provided for comfort. Orthopedic referral provided. Plan pain medications and close outpatient followup.       Sunnie Nielsen, MD 10/25/11 [phone]

## 2011-10-25 NOTE — Discharge Instructions (Signed)
Shoulder Pain   The shoulder is a ball and socket joint. The muscles and tendons (rotator cuff) are what keep the shoulder in its joint and stable. This collection of muscles and tendons holds in the head (ball) of the humerus (upper arm bone) in the fossa (cup) of the scapula (shoulder blade). Today no reason was found for your shoulder pain. Often pain in the shoulder may be treated conservatively with temporary immobilization. For example, holding the shoulder in one place using a sling for rest. Physical therapy may be needed if problems continue.   HOME CARE INSTRUCTIONS   Apply ice to the sore area for 15 to 20 minutes, 3 to 4 times per day for the first 2 days. Put the ice in a plastic bag. Place a towel between the bag of ice and your skin.   If you have or were given a shoulder sling and straps, do not remove for as long as directed by your caregiver or until you see a caregiver for a follow-up examination. If you need to remove it to shower or bathe, move your arm as little as possible.   Sleep on several pillows at night to lessen swelling and pain.   Only take over-the-counter or prescription medicines for pain, discomfort, or fever as directed by your caregiver.   Keep any follow-up appointments in order to avoid any type of permanent shoulder disability or chronic pain problems.   SEEK MEDICAL CARE IF:   Pain in your shoulder increases or new pain develops in your arm, hand, or fingers.   Your hand or fingers are colder than your other hand.   You do not obtain pain relief with the medications or your pain becomes worse.   SEEK IMMEDIATE MEDICAL CARE IF:   Your arm, hand, or fingers are numb or tingling.   Your arm, hand, or fingers are swollen, painful, or turn white or blue.   You develop chest pain or shortness of breath.   MAKE SURE YOU:   Understand these instructions.   Will watch your condition.   Will get help right away if you are not doing well or get worse

## 2011-12-08 ENCOUNTER — Ambulatory Visit: Payer: Medicare Other | Admitting: Internal Medicine

## 2012-01-02 ENCOUNTER — Other Ambulatory Visit: Payer: Self-pay | Admitting: Internal Medicine

## 2012-01-02 ENCOUNTER — Ambulatory Visit (INDEPENDENT_AMBULATORY_CARE_PROVIDER_SITE_OTHER): Payer: Medicare Other | Admitting: Internal Medicine

## 2012-01-02 ENCOUNTER — Encounter: Payer: Self-pay | Admitting: Internal Medicine

## 2012-01-02 VITALS — BP 130/70 | HR 67 | Temp 97.6°F | Ht 67.0 in | Wt 168.8 lb

## 2012-01-02 DIAGNOSIS — Z86010 Personal history of colonic polyps: Secondary | ICD-10-CM

## 2012-01-02 DIAGNOSIS — K5790 Diverticulosis of intestine, part unspecified, without perforation or abscess without bleeding: Secondary | ICD-10-CM

## 2012-01-02 MED ORDER — PEG 3350-KCL-NA BICARB-NACL 420 G PO SOLR: ORAL | Status: AC

## 2012-01-02 NOTE — Patient Instructions (Addendum)
Schedule colonoscopy to follow-up on polyps - noon to early afternoon appointment

## 2012-01-02 NOTE — Progress Notes (Signed)
Primary Care Physician:  Kirk Ruths, MD Primary Gastroenterologist:  Dr. Jena Gauss  Pre-Procedure History & Physical: HPI:  Shannon Maldonado is a 76 y.o. female is here for a surveillance colonoscopy.  History of a tubulovillous adenoma removed in 2005; negative surveillance colonoscopy 2008( diverticulosis). No bowel symptoms currently. Denies abdominal pain or rectal bleeding. The upper GI tract symptoms such as dysphagia nausea vomiting early satiety.  Past Medical History  Diagnosis Date  . Seizures   . Hypertension   . High cholesterol   . Coronary artery disease   . Diabetes mellitus   . Diverticula of colon   . Tubulovillous adenoma 07/13/2003  . H/O Clostridium difficile infection 2005  . Fatty liver     Past Surgical History  Procedure Date  . Abdominal hysterectomy   . Colonoscopy 07/23/2003    Dr. Jena Gauss- normal rectum, L side diverticula, tubulovillous adenoma  . Colonoscopy 09/26/2006    Dr. Jena Gauss- normal rectum, L side diverticula    Prior to Admission medications   Medication Sig Start Date End Date Taking? Authorizing Provider  aspirin 81 MG tablet Take 81 mg by mouth daily.   Yes Historical Provider, MD  cetirizine (ZYRTEC) 10 MG chewable tablet Chew 10 mg by mouth daily.   Yes Historical Provider, MD  folic acid (FOLVITE) 400 MCG tablet Take 400 mcg by mouth daily.   Yes Historical Provider, MD  levETIRAcetam (KEPPRA) 500 MG tablet Take 500 mg by mouth 2 (two) times daily.   Yes Historical Provider, MD  metFORMIN (GLUCOPHAGE) 500 MG tablet Take 250 mg by mouth 2 (two) times daily with a meal.   Yes Historical Provider, MD  metoprolol (LOPRESSOR) 50 MG tablet Take 50 mg by mouth 2 (two) times daily.   Yes Historical Provider, MD  Potassium Gluconate 595 MG CAPS Take 595 mg by mouth 2 (two) times daily.   Yes Historical Provider, MD  simvastatin (ZOCOR) 40 MG tablet Take 40 mg by mouth every evening.   Yes Historical Provider, MD    Allergies as of 01/02/2012 -  Review Complete 01/02/2012  Allergen Reaction Noted  . Penicillins  10/25/2011    No family history on file.  History   Social History  . Marital Status: Widowed    Spouse Name: N/A    Number of Children: N/A  . Years of Education: N/A   Occupational History  . Not on file.   Social History Main Topics  . Smoking status: Never Smoker   . Smokeless tobacco: Not on file  . Alcohol Use: No  . Drug Use: No  . Sexually Active:    Other Topics Concern  . Not on file   Social History Narrative  . No narrative on file    Review of Systems: See HPI, otherwise negative ROS  Physical Exam: BP 130/70  Pulse 67  Temp 97.6 F (36.4 C) (Temporal)  Ht 5\' 7"  (1.702 m)  Wt 168 lb 12.8 oz (76.567 kg)  BMI 26.44 kg/m2 General:   Elderly appearing well-nourished, pleasant and cooperative in NAD Head:  Normocephalic and atraumatic. Eyes:  Sclera clear, no icterus.   Conjunctiva pink. Ears:  Normal auditory acuity. Nose:  No deformity, discharge,  or lesions. Mouth:  No deformity or lesions, dentition normal. Neck:  Supple; no masses or thyromegaly. Lungs:  Clear throughout to auscultation.   No wheezes, crackles, or rhonchi. No acute distress. Heart:  Regular rate and rhythm; no murmurs, clicks, rubs,  or gallops. Abdomen:  Soft,  nontender and nondistended. No masses, hepatosplenomegaly or hernias noted. Normal bowel sounds, without guarding, and without rebound.   Msk:  Symmetrical without gross deformities. Normal posture. Pulses:  Normal pulses noted. Extremities:  Without clubbing or edema. Neurologic:  Alert and  oriented x4;  grossly normal neurologically. Skin:  Intact without significant lesions or rashes. Cervical Nodes:  No significant cervical adenopathy. Psych:  Alert and cooperative. Normal mood and affect.  Impression/Plan:  76 year old lady with history tubulovillous adenoma removed from her colon previously. She is now due for surveillance colonoscopy.The  risks, benefits, limitations, alternatives and imponderables have been reviewed with the patient. Questions have been answered. All parties are agreeable.

## 2012-01-02 NOTE — Progress Notes (Signed)
j

## 2012-01-18 ENCOUNTER — Encounter (HOSPITAL_COMMUNITY): Payer: Self-pay | Admitting: Pharmacy Technician

## 2012-01-30 MED ORDER — SODIUM CHLORIDE 0.45 % IV SOLN
Freq: Once | INTRAVENOUS | Status: AC
  Administered 2012-01-31: 1000 mL via INTRAVENOUS

## 2012-01-31 ENCOUNTER — Ambulatory Visit (HOSPITAL_COMMUNITY)
Admission: RE | Admit: 2012-01-31 | Discharge: 2012-01-31 | Disposition: A | Discharge status: Home / Self Care | Payer: Medicare Other | Source: Ambulatory Visit | Attending: Internal Medicine | Admitting: Internal Medicine

## 2012-01-31 ENCOUNTER — Encounter (HOSPITAL_COMMUNITY)
Admission: RE | Disposition: A | Discharge status: Home / Self Care | Payer: Self-pay | Source: Ambulatory Visit | Attending: Internal Medicine

## 2012-01-31 ENCOUNTER — Encounter (HOSPITAL_COMMUNITY): Payer: Self-pay | Admitting: *Deleted

## 2012-01-31 DIAGNOSIS — E119 Type 2 diabetes mellitus without complications: Secondary | ICD-10-CM | POA: Insufficient documentation

## 2012-01-31 DIAGNOSIS — D126 Benign neoplasm of colon, unspecified: Secondary | ICD-10-CM | POA: Insufficient documentation

## 2012-01-31 DIAGNOSIS — E7800 Pure hypercholesterolemia, unspecified: Secondary | ICD-10-CM | POA: Insufficient documentation

## 2012-01-31 DIAGNOSIS — Z01812 Encounter for preprocedural laboratory examination: Secondary | ICD-10-CM | POA: Insufficient documentation

## 2012-01-31 DIAGNOSIS — Z860100 Personal history of colon polyps, unspecified: Secondary | ICD-10-CM | POA: Insufficient documentation

## 2012-01-31 DIAGNOSIS — K5790 Diverticulosis of intestine, part unspecified, without perforation or abscess without bleeding: Secondary | ICD-10-CM

## 2012-01-31 DIAGNOSIS — I10 Essential (primary) hypertension: Secondary | ICD-10-CM | POA: Insufficient documentation

## 2012-01-31 DIAGNOSIS — Z1211 Encounter for screening for malignant neoplasm of colon: Secondary | ICD-10-CM

## 2012-01-31 HISTORY — PX: COLONOSCOPY: SHX5424

## 2012-01-31 SURGERY — COLONOSCOPY
Anesthesia: Moderate Sedation

## 2012-01-31 MED ORDER — MIDAZOLAM HCL 5 MG/5ML IJ SOLN
INTRAMUSCULAR | Status: DC | PRN
  Administered 2012-01-31: 1 mg via INTRAVENOUS
  Administered 2012-01-31: 2 mg via INTRAVENOUS

## 2012-01-31 MED ORDER — MIDAZOLAM HCL 5 MG/5ML IJ SOLN
INTRAMUSCULAR | Status: AC
  Filled 2012-01-31: qty 5

## 2012-01-31 MED ORDER — MEPERIDINE HCL 100 MG/ML IJ SOLN
INTRAMUSCULAR | Status: AC
  Filled 2012-01-31: qty 2

## 2012-01-31 MED ORDER — MEPERIDINE HCL 100 MG/ML IJ SOLN
INTRAMUSCULAR | Status: DC | PRN
  Administered 2012-01-31: 50 mg via INTRAVENOUS
  Administered 2012-01-31: 25 mg via INTRAVENOUS

## 2012-01-31 MED ORDER — STERILE WATER FOR IRRIGATION IR SOLN
Status: DC | PRN
  Administered 2012-01-31: 12:00:00

## 2012-01-31 NOTE — H&P (View-Only) (Signed)
Primary Care Physician:  MCGOUGH,WILLIAM M, MD Primary Gastroenterologist:  Dr. Terresa Marlett  Pre-Procedure History & Physical: HPI:  Shannon Maldonado is a 76 y.o. female is here for a surveillance colonoscopy.  History of a tubulovillous adenoma removed in 2005; negative surveillance colonoscopy 2008( diverticulosis). No bowel symptoms currently. Denies abdominal pain or rectal bleeding. The upper GI tract symptoms such as dysphagia nausea vomiting early satiety.  Past Medical History  Diagnosis Date  . Seizures   . Hypertension   . High cholesterol   . Coronary artery disease   . Diabetes mellitus   . Diverticula of colon   . Tubulovillous adenoma 07/13/2003  . H/O Clostridium difficile infection 2005  . Fatty liver     Past Surgical History  Procedure Date  . Abdominal hysterectomy   . Colonoscopy 07/23/2003    Dr. Amanie Mcculley- normal rectum, L side diverticula, tubulovillous adenoma  . Colonoscopy 09/26/2006    Dr. Savien Mamula- normal rectum, L side diverticula    Prior to Admission medications   Medication Sig Start Date End Date Taking? Authorizing Provider  aspirin 81 MG tablet Take 81 mg by mouth daily.   Yes Historical Provider, MD  cetirizine (ZYRTEC) 10 MG chewable tablet Chew 10 mg by mouth daily.   Yes Historical Provider, MD  folic acid (FOLVITE) 400 MCG tablet Take 400 mcg by mouth daily.   Yes Historical Provider, MD  levETIRAcetam (KEPPRA) 500 MG tablet Take 500 mg by mouth 2 (two) times daily.   Yes Historical Provider, MD  metFORMIN (GLUCOPHAGE) 500 MG tablet Take 250 mg by mouth 2 (two) times daily with a meal.   Yes Historical Provider, MD  metoprolol (LOPRESSOR) 50 MG tablet Take 50 mg by mouth 2 (two) times daily.   Yes Historical Provider, MD  Potassium Gluconate 595 MG CAPS Take 595 mg by mouth 2 (two) times daily.   Yes Historical Provider, MD  simvastatin (ZOCOR) 40 MG tablet Take 40 mg by mouth every evening.   Yes Historical Provider, MD    Allergies as of 01/02/2012 -  Review Complete 01/02/2012  Allergen Reaction Noted  . Penicillins  10/25/2011    No family history on file.  History   Social History  . Marital Status: Widowed    Spouse Name: N/A    Number of Children: N/A  . Years of Education: N/A   Occupational History  . Not on file.   Social History Main Topics  . Smoking status: Never Smoker   . Smokeless tobacco: Not on file  . Alcohol Use: No  . Drug Use: No  . Sexually Active:    Other Topics Concern  . Not on file   Social History Narrative  . No narrative on file    Review of Systems: See HPI, otherwise negative ROS  Physical Exam: BP 130/70  Pulse 67  Temp 97.6 F (36.4 C) (Temporal)  Ht 5' 7" (1.702 m)  Wt 168 lb 12.8 oz (76.567 kg)  BMI 26.44 kg/m2 General:   Elderly appearing well-nourished, pleasant and cooperative in NAD Head:  Normocephalic and atraumatic. Eyes:  Sclera clear, no icterus.   Conjunctiva pink. Ears:  Normal auditory acuity. Nose:  No deformity, discharge,  or lesions. Mouth:  No deformity or lesions, dentition normal. Neck:  Supple; no masses or thyromegaly. Lungs:  Clear throughout to auscultation.   No wheezes, crackles, or rhonchi. No acute distress. Heart:  Regular rate and rhythm; no murmurs, clicks, rubs,  or gallops. Abdomen:  Soft,   nontender and nondistended. No masses, hepatosplenomegaly or hernias noted. Normal bowel sounds, without guarding, and without rebound.   Msk:  Symmetrical without gross deformities. Normal posture. Pulses:  Normal pulses noted. Extremities:  Without clubbing or edema. Neurologic:  Alert and  oriented x4;  grossly normal neurologically. Skin:  Intact without significant lesions or rashes. Cervical Nodes:  No significant cervical adenopathy. Psych:  Alert and cooperative. Normal mood and affect.  Impression/Plan:  76-year-old lady with history tubulovillous adenoma removed from her colon previously. She is now due for surveillance colonoscopy.The  risks, benefits, limitations, alternatives and imponderables have been reviewed with the patient. Questions have been answered. All parties are agreeable.   

## 2012-01-31 NOTE — Op Note (Signed)
[facility] [address] [place], [place]  [address]  COLONOSCOPY PROCEDURE REPORT  PATIENT:  Shannon Maldonado, Shannon Maldonado  MR#:  [id number] BIRTHDATE:  06-14-1936, 76 yrs. old  GENDER:  female ENDOSCOPIST:  R. Roetta Sessions, MD FACP [facility] REF. BY:  Karleen Hampshire, M.D. PROCEDURE DATE:  01/31/2012 PROCEDURE:  Colonoscopy with biopsy  INDICATIONS:  History of colonic polyps  INFORMED CONSENT:  The risks, benefits, alternatives and imponderables including but not limited to bleeding, perforation as well as the possibility of a missed lesion have been reviewed. The potential for biopsy, lesion removal, etc. have also been discussed.  Questions have been answered.  All parties agreeable. Please see the history and physical in the medical record for more information.  MEDICATIONS:  Versed 3 mg IV and Demerol 50 mg IV in divided doses  DESCRIPTION OF PROCEDURE:  After a digital rectal exam was performed, the EC-[id number] ([id number]) colonoscope was advanced from the anus through the rectum and colon to the area of the cecum, ileocecal valve and appendiceal orifice.  The cecum was deeply intubated.  These structures were well-seen and photographed for the record.  From the level of the cecum and ileocecal valve, the scope was slowly and cautiously withdrawn.  The mucosal surfaces were carefully surveyed utilizing scope tip deflection to facilitate fold flattening as needed.  The scope was pulled down into the rectum where a thorough examination including retroflexion was performed. <<PROCEDUREIMAGES>>  FINDINGS: Adequate preparation. Normal rectum. 2 diminutive polyps in the mid sigmoid segment; otherwise, the remainder of colonic mucosa appeared normal.  THERAPEUTIC / DIAGNOSTIC MANEUVERS PERFORMED: The 2 sigmoid polyps were cold biopsied/removed.  COMPLICATIONS:  None  CECAL WITHDRAWAL TIME: 9 minutes  IMPRESSION: Colonic polyps-removed as described above  RECOMMENDATIONS:  Followup on pathology.  ______________________________ R. Roetta Sessions, MD Caleen Essex  CC:  Karleen Hampshire, M.D.  n. eSIGNED:   R. Roetta Sessions at 01/31/2012 12:15 PM  Threasa Heads, [phone]

## 2012-01-31 NOTE — Interval H&P Note (Signed)
History and Physical Interval Note:  01/31/2012 11:39 AM  Shannon Maldonado  has presented today for surgery, with the diagnosis of HISTORY OF COLON POLYPS, DIVERTICULOSIS  The various methods of treatment have been discussed with the patient and family. After consideration of risks, benefits and other options for treatment, the patient has consented to  Procedure(s) (LRB): COLONOSCOPY (N/A) as a surgical intervention .  The patient's history has been reviewed, patient examined, no change in status, stable for surgery.  I have reviewed the patient's chart and labs.  Questions were answered to the patient's satisfaction.     Eula Listen

## 2012-02-04 ENCOUNTER — Encounter: Payer: Self-pay | Admitting: Internal Medicine

## 2012-02-05 ENCOUNTER — Encounter (HOSPITAL_COMMUNITY): Payer: Self-pay | Admitting: Internal Medicine

## 2012-02-05 ENCOUNTER — Encounter: Payer: Self-pay | Admitting: *Deleted

## 2012-08-24 ENCOUNTER — Emergency Department (HOSPITAL_COMMUNITY): Payer: Medicare Other

## 2012-08-24 ENCOUNTER — Emergency Department (HOSPITAL_COMMUNITY)
Admission: EM | Admit: 2012-08-24 | Discharge: 2012-08-24 | Disposition: A | Discharge status: Home / Self Care | Payer: Medicare Other | Attending: Emergency Medicine | Admitting: Emergency Medicine

## 2012-08-24 ENCOUNTER — Encounter (HOSPITAL_COMMUNITY): Payer: Self-pay | Admitting: Emergency Medicine

## 2012-08-24 DIAGNOSIS — Z7982 Long term (current) use of aspirin: Secondary | ICD-10-CM | POA: Insufficient documentation

## 2012-08-24 DIAGNOSIS — Z8619 Personal history of other infectious and parasitic diseases: Secondary | ICD-10-CM | POA: Insufficient documentation

## 2012-08-24 DIAGNOSIS — Z8669 Personal history of other diseases of the nervous system and sense organs: Secondary | ICD-10-CM | POA: Insufficient documentation

## 2012-08-24 DIAGNOSIS — E119 Type 2 diabetes mellitus without complications: Secondary | ICD-10-CM | POA: Insufficient documentation

## 2012-08-24 DIAGNOSIS — R0602 Shortness of breath: Secondary | ICD-10-CM | POA: Insufficient documentation

## 2012-08-24 DIAGNOSIS — R259 Unspecified abnormal involuntary movements: Secondary | ICD-10-CM | POA: Insufficient documentation

## 2012-08-24 DIAGNOSIS — R42 Dizziness and giddiness: Secondary | ICD-10-CM | POA: Insufficient documentation

## 2012-08-24 DIAGNOSIS — R110 Nausea: Secondary | ICD-10-CM | POA: Insufficient documentation

## 2012-08-24 DIAGNOSIS — Z79899 Other long term (current) drug therapy: Secondary | ICD-10-CM | POA: Insufficient documentation

## 2012-08-24 DIAGNOSIS — I2510 Atherosclerotic heart disease of native coronary artery without angina pectoris: Secondary | ICD-10-CM | POA: Insufficient documentation

## 2012-08-24 DIAGNOSIS — R059 Cough, unspecified: Secondary | ICD-10-CM | POA: Insufficient documentation

## 2012-08-24 DIAGNOSIS — F17200 Nicotine dependence, unspecified, uncomplicated: Secondary | ICD-10-CM | POA: Insufficient documentation

## 2012-08-24 DIAGNOSIS — F419 Anxiety disorder, unspecified: Secondary | ICD-10-CM

## 2012-08-24 DIAGNOSIS — Z9089 Acquired absence of other organs: Secondary | ICD-10-CM | POA: Insufficient documentation

## 2012-08-24 DIAGNOSIS — R109 Unspecified abdominal pain: Secondary | ICD-10-CM | POA: Insufficient documentation

## 2012-08-24 DIAGNOSIS — E7800 Pure hypercholesterolemia, unspecified: Secondary | ICD-10-CM | POA: Insufficient documentation

## 2012-08-24 DIAGNOSIS — F411 Generalized anxiety disorder: Secondary | ICD-10-CM | POA: Insufficient documentation

## 2012-08-24 DIAGNOSIS — R6883 Chills (without fever): Secondary | ICD-10-CM | POA: Insufficient documentation

## 2012-08-24 DIAGNOSIS — Z90710 Acquired absence of both cervix and uterus: Secondary | ICD-10-CM | POA: Insufficient documentation

## 2012-08-24 DIAGNOSIS — Z8719 Personal history of other diseases of the digestive system: Secondary | ICD-10-CM | POA: Insufficient documentation

## 2012-08-24 DIAGNOSIS — I10 Essential (primary) hypertension: Secondary | ICD-10-CM | POA: Insufficient documentation

## 2012-08-24 LAB — BASIC METABOLIC PANEL
BUN: 8 mg/dL (ref 6–23)
Chloride: 99 mEq/L (ref 96–112)
Creatinine, Ser: 0.73 mg/dL (ref 0.50–1.10)
Glucose, Bld: 111 mg/dL — ABNORMAL HIGH (ref 70–99)
Potassium: 3.7 mEq/L (ref 3.5–5.1)

## 2012-08-24 LAB — URINALYSIS, ROUTINE W REFLEX MICROSCOPIC
Glucose, UA: NEGATIVE mg/dL
Hgb urine dipstick: NEGATIVE
Leukocytes, UA: NEGATIVE
pH: 8 (ref 5.0–8.0)

## 2012-08-24 LAB — CBC WITH DIFFERENTIAL/PLATELET
Eosinophils Absolute: 0.3 10*3/uL (ref 0.0–0.7)
HCT: 42.6 % (ref 36.0–46.0)
Hemoglobin: 14.7 g/dL (ref 12.0–15.0)
Lymphs Abs: 2.1 10*3/uL (ref 0.7–4.0)
MCH: 30.6 pg (ref 26.0–34.0)
MCHC: 34.5 g/dL (ref 30.0–36.0)
Monocytes Absolute: 0.6 10*3/uL (ref 0.1–1.0)
Monocytes Relative: 9 % (ref 3–12)
Neutro Abs: 4 10*3/uL (ref 1.7–7.7)
Neutrophils Relative %: 56 % (ref 43–77)
RBC: 4.81 MIL/uL (ref 3.87–5.11)

## 2012-08-24 NOTE — ED Notes (Signed)
Per EMS pt woke up this am c/o of nausea,dizziness,SOB,weakness, and "tremors". Per EMS pt also c/o of intermittent RLQ pain. CBG en route 86.Pt alert and oriented. nad noted.

## 2012-08-24 NOTE — ED Provider Notes (Signed)
History    This chart was scribed for Gilda Crease, MD by Charolett Bumpers, ED Scribe. The patient was seen in room APA07/[facility]07. Patient's care was started at 1420.   CSN: [id number]  Arrival date & time 08/24/12  1417   First MD Initiated Contact with Patient 08/24/12 1420      Chief Complaint  Patient presents with  . Weakness  . Tremors  . Shortness of Breath  . Dizziness     HPI Comments: Shannon Maldonado is a 77 y.o. female who presents to the Emergency Department via EMS complaining of moderate generalized weakness that started this morning around 7:30 after waking. She reports associated shaking and tremors, nausea, cough, SOB, dizziness and right side pain. The side pain has been present intermittently "for a while". She reports a h/o faint-like episodes that are recurrent. She states she what occurred this morning was consistent with one of these "episodes". She reports her symptoms have improved since then, but are mildly present. She denies any vomiting or diarrhea. She denies any h/o COPD and does not have any breathing treatments at home. EMS states she was very tense, anxious and shaking in the bed upon their arrival. EMS states that since then, the pt has improved.  The history is provided by the patient and the EMS personnel. No language interpreter was used.    Past Medical History  Diagnosis Date  . Seizures   . Hypertension   . High cholesterol   . Coronary artery disease   . Diabetes mellitus   . Diverticula of colon   . Tubulovillous adenoma 07/13/2003  . H/O Clostridium difficile infection 2005  . Fatty liver     Past Surgical History  Procedure Laterality Date  . Abdominal hysterectomy    . Colonoscopy  07/23/2003    Dr. Jena Gauss- normal rectum, L side diverticula, tubulovillous adenoma  . Colonoscopy  09/26/2006    Dr. Jena Gauss- normal rectum, L side diverticula  . Orif right hip    . Appendectomy    . Bilateral cataract surg    . Colonoscopy   01/31/2012    Procedure: COLONOSCOPY;  Surgeon: Corbin Ade, MD;  Location: [facility];  Service: Endoscopy;  Laterality: N/A;  12:00    No family history on file.  History  Substance Use Topics  . Smoking status: Current Some Day Smoker -- 1.00 packs/day for 50 years    Types: Cigarettes  . Smokeless tobacco: Not on file  . Alcohol Use: No    OB History   Grav Para Term Preterm Abortions TAB SAB Ect Mult Living                  Review of Systems  Constitutional: Positive for chills. Negative for fever.  Respiratory: Positive for cough and shortness of breath.   Gastrointestinal: Positive for abdominal pain. Negative for vomiting and diarrhea.  Neurological: Positive for dizziness and weakness.  All other systems reviewed and are negative.    Allergies  Penicillins  Home Medications   Current Outpatient Rx  Name  Route  Sig  Dispense  Refill  . aspirin EC 81 MG tablet   Oral   Take 81 mg by mouth daily.         . cetirizine (ZYRTEC) 10 MG chewable tablet   Oral   Chew 10 mg by mouth daily.         . folic acid (FOLVITE) 400 MCG tablet   Oral  Take 400 mcg by mouth daily.         Marland Kitchen levETIRAcetam (KEPPRA) 500 MG tablet   Oral   Take 500 mg by mouth 2 (two) times daily.         . metFORMIN (GLUCOPHAGE) 500 MG tablet   Oral   Take 250 mg by mouth 2 (two) times daily with a meal.         . metoprolol (LOPRESSOR) 50 MG tablet   Oral   Take 50 mg by mouth 2 (two) times daily.         . Multiple Vitamin (MULTIVITAMIN WITH MINERALS) TABS   Oral   Take 1 tablet by mouth daily.         . naproxen sodium (ANAPROX) 220 MG tablet   Oral   Take 220 mg by mouth every 8 (eight) hours as needed. For pain         . Potassium Gluconate 595 MG CAPS   Oral   Take 595 mg by mouth 2 (two) times daily.         . simvastatin (ZOCOR) 40 MG tablet   Oral   Take 40 mg by mouth every evening.           BP 185/86  Pulse 77  Temp(Src) 98.7 F  (37.1 C) (Oral)  Resp 20  Ht 5\' 8"  (1.727 m)  Wt 179 lb (81.194 kg)  BMI 27.22 kg/m2  SpO2 97%  Physical Exam  Constitutional: She is oriented to person, place, and time. She appears well-developed and well-nourished. No distress.  HENT:  Head: Normocephalic and atraumatic.  Right Ear: Hearing normal.  Left Ear: Hearing normal.  Nose: Nose normal.  Mouth/Throat: Oropharynx is clear and moist and mucous membranes are normal.  Eyes: Conjunctivae and EOM are normal. Pupils are equal, round, and reactive to light.  Neck: Normal range of motion. Neck supple.  Cardiovascular: Normal rate, regular rhythm, S1 normal, S2 normal and normal heart sounds.  Exam reveals no gallop and no friction rub.   No murmur heard. Pulmonary/Chest: Effort normal and breath sounds normal. No respiratory distress. She exhibits no tenderness.  Abdominal: Soft. Normal appearance and bowel sounds are normal. There is no hepatosplenomegaly. There is no tenderness. There is no rebound, no guarding, no tenderness at McBurney's point and negative Murphy's sign. No hernia.  Musculoskeletal: Normal range of motion.  Neurological: She is alert and oriented to person, place, and time. She has normal strength. No cranial nerve deficit or sensory deficit. Coordination normal. GCS eye subscore is 4. GCS verbal subscore is 5. GCS motor subscore is 6.  Skin: Skin is warm, dry and intact. No rash noted. No cyanosis.  Psychiatric: She has a normal mood and affect. Her behavior is normal. Thought content normal.    ED Course  Procedures (including critical care time)  EKG:  Date: 08/24/2012  Rate: 74  Rhythm: normal sinus rhythm  QRS Axis: normal  Intervals: normal  ST/T Wave abnormalities: normal  Conduction Disutrbances:right bundle branch block  Narrative Interpretation:   Old EKG Reviewed: unchanged    DIAGNOSTIC STUDIES: Oxygen Saturation is 97% on room air, normal by my interpretation.    COORDINATION OF  CARE:  14:27-Discussed planned course of treatment with the patient including a chest x-ray, CBC with diff, BMP, UA and checking troponin and BNP, who is agreeable at this time.    Labs Reviewed  URINALYSIS, ROUTINE W REFLEX MICROSCOPIC  CBC WITH DIFFERENTIAL  BASIC METABOLIC  PANEL  TROPONIN I  PRO B NATRIURETIC PEPTIDE   Dg Chest 2 View  08/24/2012  *RADIOLOGY REPORT*  Clinical Data: Shortness of breath.  CHEST - 2 VIEW  Comparison: 07/04/2010  Findings: There is hyperinflation of the lungs compatible with COPD.  Scarring in the lung bases.  No acute opacities or effusions.  Old severe compression fracture in the lower thoracic spine or upper lumbar spine, stable.  IMPRESSION: COPD/chronic changes.  No acute findings.   Original Report Authenticated By: Charlett Nose, M.D.      Diagnosis: Anxiety    MDM  Patient brought to the ER by EMS. EMS report that upon their arrival patient was extremely anxious. Apparently she had a family member die 2 nights ago and has been upset about this ever since. She has a history of anxiety, but apparently antianxiety medications given to her by her primary doctor have caused hallucinations, so she is not currently taking any medicine for the anxiety.  Examination at arrival was entirely unremarkable other than the anxiety. Lung sounds are clear, oxygenation is normal. Patient is breathing comfortably. Her workup was entirely unremarkable. She is feeling much better, symptoms resolved. Patient to be discharged home.   I personally performed the services described in this documentation, which was scribed in my presence. The recorded information has been reviewed and is accurate.       Gilda Crease, MD 08/24/12 [phone]

## 2012-09-02 ENCOUNTER — Other Ambulatory Visit (HOSPITAL_COMMUNITY): Payer: Self-pay | Admitting: Family Medicine

## 2012-09-02 DIAGNOSIS — Z139 Encounter for screening, unspecified: Secondary | ICD-10-CM

## 2012-09-09 ENCOUNTER — Ambulatory Visit (HOSPITAL_COMMUNITY): Payer: Medicare Other

## 2012-09-16 ENCOUNTER — Ambulatory Visit (HOSPITAL_COMMUNITY)
Admission: RE | Admit: 2012-09-16 | Discharge: 2012-09-16 | Disposition: A | Discharge status: Home / Self Care | Payer: Medicare Other | Source: Ambulatory Visit | Attending: Family Medicine | Admitting: Family Medicine

## 2012-09-16 DIAGNOSIS — Z139 Encounter for screening, unspecified: Secondary | ICD-10-CM

## 2012-09-16 DIAGNOSIS — Z1231 Encounter for screening mammogram for malignant neoplasm of breast: Secondary | ICD-10-CM | POA: Insufficient documentation

## 2012-11-25 ENCOUNTER — Observation Stay (HOSPITAL_BASED_OUTPATIENT_CLINIC_OR_DEPARTMENT_OTHER)
Admission: EM | Admit: 2012-11-25 | Discharge: 2012-11-26 | Disposition: A | Discharge status: Home / Self Care | Payer: Medicare Other | Source: Home / Self Care | Attending: Emergency Medicine | Admitting: Emergency Medicine

## 2012-11-25 ENCOUNTER — Observation Stay (HOSPITAL_COMMUNITY): Payer: Medicare Other

## 2012-11-25 ENCOUNTER — Inpatient Hospital Stay (HOSPITAL_COMMUNITY): Payer: Medicare Other

## 2012-11-25 ENCOUNTER — Encounter (HOSPITAL_COMMUNITY): Payer: Self-pay | Admitting: *Deleted

## 2012-11-25 ENCOUNTER — Emergency Department (HOSPITAL_COMMUNITY): Payer: Medicare Other

## 2012-11-25 DIAGNOSIS — I517 Cardiomegaly: Secondary | ICD-10-CM

## 2012-11-25 DIAGNOSIS — R092 Respiratory arrest: Secondary | ICD-10-CM

## 2012-11-25 DIAGNOSIS — R930 Abnormal findings on diagnostic imaging of skull and head, not elsewhere classified: Secondary | ICD-10-CM

## 2012-11-25 DIAGNOSIS — R55 Syncope and collapse: Secondary | ICD-10-CM

## 2012-11-25 DIAGNOSIS — Z87820 Personal history of traumatic brain injury: Secondary | ICD-10-CM

## 2012-11-25 DIAGNOSIS — E119 Type 2 diabetes mellitus without complications: Secondary | ICD-10-CM

## 2012-11-25 DIAGNOSIS — F17200 Nicotine dependence, unspecified, uncomplicated: Secondary | ICD-10-CM

## 2012-11-25 DIAGNOSIS — R569 Unspecified convulsions: Secondary | ICD-10-CM

## 2012-11-25 DIAGNOSIS — F17210 Nicotine dependence, cigarettes, uncomplicated: Secondary | ICD-10-CM

## 2012-11-25 HISTORY — DX: Respiratory arrest: R09.2

## 2012-11-25 HISTORY — DX: Syncope and collapse: R55

## 2012-11-25 LAB — URINALYSIS, ROUTINE W REFLEX MICROSCOPIC
Bilirubin Urine: NEGATIVE
Protein, ur: 100 mg/dL — AB
Urobilinogen, UA: 0.2 mg/dL (ref 0.0–1.0)

## 2012-11-25 LAB — CBC WITH DIFFERENTIAL/PLATELET
Basophils Absolute: 0 10*3/uL (ref 0.0–0.1)
HCT: 44.2 % (ref 36.0–46.0)
Hemoglobin: 15.6 g/dL — ABNORMAL HIGH (ref 12.0–15.0)
Lymphocytes Relative: 13 % (ref 12–46)
Lymphs Abs: 0.8 10*3/uL (ref 0.7–4.0)
Monocytes Absolute: 0.6 10*3/uL (ref 0.1–1.0)
Monocytes Relative: 10 % (ref 3–12)
Neutro Abs: 5.2 10*3/uL (ref 1.7–7.7)
RBC: 5.13 MIL/uL — ABNORMAL HIGH (ref 3.87–5.11)
WBC: 6.7 10*3/uL (ref 4.0–10.5)

## 2012-11-25 LAB — COMPREHENSIVE METABOLIC PANEL
AST: 33 U/L (ref 0–37)
CO2: 28 mEq/L (ref 19–32)
Chloride: 97 mEq/L (ref 96–112)
Creatinine, Ser: 0.75 mg/dL (ref 0.50–1.10)
GFR calc non Af Amer: 81 mL/min — ABNORMAL LOW (ref >90)
Glucose, Bld: 139 mg/dL — ABNORMAL HIGH (ref 70–99)
Total Bilirubin: 0.4 mg/dL (ref 0.3–1.2)

## 2012-11-25 LAB — GLUCOSE, CAPILLARY
Glucose-Capillary: 121 mg/dL — ABNORMAL HIGH (ref 70–99)
Glucose-Capillary: 117 mg/dL — ABNORMAL HIGH (ref 70–99)

## 2012-11-25 LAB — POCT I-STAT TROPONIN I: Troponin i, poc: 0 ng/mL (ref 0.00–0.08)

## 2012-11-25 LAB — HEMOGLOBIN A1C
Hgb A1c MFr Bld: 5.8 % — ABNORMAL HIGH (ref <5.7)
Mean Plasma Glucose: 120 mg/dL — ABNORMAL HIGH (ref <117)

## 2012-11-25 LAB — RAPID URINE DRUG SCREEN, HOSP PERFORMED
Barbiturates: NOT DETECTED
Benzodiazepines: NOT DETECTED

## 2012-11-25 LAB — LACTIC ACID, PLASMA: Lactic Acid, Venous: 1.9 mmol/L (ref 0.5–2.2)

## 2012-11-25 LAB — URINE MICROSCOPIC-ADD ON

## 2012-11-25 MED ORDER — SIMVASTATIN 20 MG PO TABS
40.0000 mg | ORAL_TABLET | Freq: Every evening | ORAL | Status: DC
  Administered 2012-11-25: 40 mg via ORAL
  Filled 2012-11-25: qty 2

## 2012-11-25 MED ORDER — IOHEXOL 350 MG/ML SOLN
75.0000 mL | Freq: Once | INTRAVENOUS | Status: AC | PRN
  Administered 2012-11-25: 75 mL via INTRAVENOUS

## 2012-11-25 MED ORDER — SODIUM CHLORIDE 0.9 % IV SOLN
INTRAVENOUS | Status: AC
  Administered 2012-11-25: 11:00:00 via INTRAVENOUS

## 2012-11-25 MED ORDER — SODIUM CHLORIDE 0.9 % IJ SOLN: 3.0000 mL | INTRAMUSCULAR | Status: DC | PRN

## 2012-11-25 MED ORDER — SODIUM CHLORIDE 0.9 % IJ SOLN
3.0000 mL | Freq: Two times a day (BID) | INTRAMUSCULAR | Status: DC
  Administered 2012-11-26: 3 mL via INTRAVENOUS

## 2012-11-25 MED ORDER — LEVETIRACETAM 500 MG PO TABS
500.0000 mg | ORAL_TABLET | Freq: Two times a day (BID) | ORAL | Status: DC
  Administered 2012-11-25 – 2012-11-26 (×3): 500 mg via ORAL
  Filled 2012-11-25 (×3): qty 1

## 2012-11-25 MED ORDER — ASPIRIN 81 MG PO CHEW
162.0000 mg | CHEWABLE_TABLET | Freq: Every day | ORAL | Status: DC
  Filled 2012-11-25: qty 2

## 2012-11-25 MED ORDER — ACETAMINOPHEN 325 MG PO TABS
650.0000 mg | ORAL_TABLET | Freq: Once | ORAL | Status: AC
  Administered 2012-11-25: 650 mg via ORAL

## 2012-11-25 MED ORDER — LORATADINE 10 MG PO TABS
10.0000 mg | ORAL_TABLET | Freq: Every day | ORAL | Status: DC
  Administered 2012-11-26: 10 mg via ORAL
  Filled 2012-11-25 (×2): qty 1

## 2012-11-25 MED ORDER — DEXTROSE 5 % IV SOLN
1.0000 g | Freq: Once | INTRAVENOUS | Status: AC
  Administered 2012-11-25: 1 g via INTRAVENOUS
  Filled 2012-11-25: qty 10

## 2012-11-25 MED ORDER — ASPIRIN 325 MG PO TABS
325.0000 mg | ORAL_TABLET | Freq: Every day | ORAL | Status: DC
  Administered 2012-11-25 – 2012-11-26 (×2): 325 mg via ORAL
  Filled 2012-11-25 (×3): qty 1

## 2012-11-25 MED ORDER — DEXTROSE 5 % IV SOLN
500.0000 mg | Freq: Once | INTRAVENOUS | Status: AC
  Administered 2012-11-25: 500 mg via INTRAVENOUS
  Filled 2012-11-25: qty 500

## 2012-11-25 MED ORDER — SODIUM CHLORIDE 0.9 % IV BOLUS (SEPSIS)
500.0000 mL | Freq: Once | INTRAVENOUS | Status: AC
  Administered 2012-11-25: 500 mL via INTRAVENOUS

## 2012-11-25 MED ORDER — SODIUM CHLORIDE 0.9 % IV SOLN: 250.0000 mL | INTRAVENOUS | Status: DC | PRN

## 2012-11-25 MED ORDER — PNEUMOCOCCAL VAC POLYVALENT 25 MCG/0.5ML IJ INJ
0.5000 mL | INJECTION | INTRAMUSCULAR | Status: AC
  Administered 2012-11-26: 0.5 mL via INTRAMUSCULAR
  Filled 2012-11-25: qty 0.5

## 2012-11-25 MED ORDER — NICOTINE 14 MG/24HR TD PT24
14.0000 mg | MEDICATED_PATCH | Freq: Every day | TRANSDERMAL | Status: DC
  Administered 2012-11-25 – 2012-11-26 (×2): 14 mg via TRANSDERMAL
  Filled 2012-11-25 (×2): qty 1

## 2012-11-25 MED ORDER — ENOXAPARIN SODIUM 40 MG/0.4ML ~~LOC~~ SOLN
40.0000 mg | Freq: Every day | SUBCUTANEOUS | Status: DC
  Administered 2012-11-25 – 2012-11-26 (×2): 40 mg via SUBCUTANEOUS
  Filled 2012-11-25 (×2): qty 0.4

## 2012-11-25 MED ORDER — ACETAMINOPHEN 325 MG PO TABS
ORAL_TABLET | ORAL | Status: AC
  Filled 2012-11-25: qty 2

## 2012-11-25 MED ORDER — INSULIN ASPART 100 UNIT/ML ~~LOC~~ SOLN
0.0000 [IU] | Freq: Three times a day (TID) | SUBCUTANEOUS | Status: DC
  Administered 2012-11-25: 1 [IU] via SUBCUTANEOUS
  Administered 2012-11-26: 2 [IU] via SUBCUTANEOUS

## 2012-11-25 NOTE — ED Notes (Signed)
Patient got up this AM, went to kitchen and was stumbling slightly.  According to family, patient sat at table and became unresponsive, not breathing briefly.  Quickly regained consciousness.  EMS performed stroke scale which was negative.  Patient c/o slight nausea and has had HA since yesterday.  Took Alleve yesterday w/out complete relief.  Reports slight dysuria recently.  Patient found to be damp from urine.

## 2012-11-25 NOTE — ED Notes (Signed)
Report called to Christina, RN on unit 300. 

## 2012-11-25 NOTE — ED Notes (Signed)
Straight cath completed on pt. Urine sample was collected. Pt tolerated well.

## 2012-11-25 NOTE — Progress Notes (Signed)
*  PRELIMINARY RESULTS* Echocardiogram 2D Echocardiogram has been performed.  Shannon Maldonado 11/25/2012, 4:07 PM
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
## 2012-11-25 NOTE — ED Provider Notes (Signed)
History     This chart was scribed for [name], MD, MD by Smitty Pluck, ED Scribe. The patient was seen in room APA06/AP[facility]06 and the patient's care was started at 7:58 AM.   CSN: [id number]  Arrival date & time 11/25/12  0725        Chief Complaint  Patient presents with  . Loss of Consciousness  . Nausea    Patient is a 77 y.o. female presenting with syncope. The history is provided by the patient and medical records. No language interpreter was used.  Loss of Consciousness Episode history:  Single Most recent episode:  Today Duration:  1 minute Timing:  Intermittent Progression:  Resolved Chronicity:  New Witnessed: yes   Relieved by:  None tried Worsened by:  Nothing tried Ineffective treatments:  None tried Associated symptoms: headaches   Associated symptoms: no fever   Risk factors: coronary artery disease and seizures    HPI Comments: Shannon Maldonado is a 77 y.o. female with hx of DM, HTN, CAD, seizures and high cholesterol who presents to the Emergency Department BIB son complaining of LOC today. Son states that pt was not awake at her usual time (normally gets up 5AM-6AM). Son states that pt walked into kitchen and he saw she was holding onto kitchen table and trembling. Pt reports that she does not remember any events after walking into kitchen. Son states he thought she had weakness in right leg. Pt's son states that he tried to sit her in a chair and she had LOC for 1 minute. Son states that pt stopped breathing but regained consciousness and started snoring. Pt reports that yesterday everything was normal except for HA at occipital area and chills. Her current temperature is 99.6 in ED. Son states he checked on her last night and she seemed at baseline. She states she took aleve for HA pain. Pt reports hx of memory loss that lasted for 3 days in March 2014 (she was not seen in hospital). Pt reports that she is ambulatory without assistance. Patient reports that this  headache was a normal headache for her.  She denies it starting unusually or being different from previous headaches.  She has some dyspnea  But reports cough at baseline without color change or frequency change.  She continues to smoke a ppd.  She thinks she ate her normal amount yesterday but had loss of appetite this a.m.  The son reports no seizure activity but she has a history of seizures and is unable to define what these are like.    PCP is Dr. Regino Schultze  Cardiologist is at [facility]  Past Medical History  Diagnosis Date  . Seizures   . Hypertension   . High cholesterol   . Coronary artery disease   . Diabetes mellitus   . Diverticula of colon   . Tubulovillous adenoma 07/13/2003  . H/O Clostridium difficile infection 2005  . Fatty liver     Past Surgical History  Procedure Laterality Date  . Abdominal hysterectomy    . Colonoscopy  07/23/2003    Dr. Jena Gauss- normal rectum, L side diverticula, tubulovillous adenoma  . Colonoscopy  09/26/2006    Dr. Jena Gauss- normal rectum, L side diverticula  . Orif right hip    . Appendectomy    . Bilateral cataract surg    . Colonoscopy  01/31/2012    Procedure: COLONOSCOPY;  Surgeon: Corbin Ade, MD;  Location: [facility];  Service: Endoscopy;  Laterality: N/A;  12:00    History reviewed. No pertinent family history.  History  Substance Use Topics  . Smoking status: Current Some Day Smoker -- 0.50 packs/day for 50 years    Types: Cigarettes  . Smokeless tobacco: Not on file  . Alcohol Use: No    OB History   Grav Para Term Preterm Abortions TAB SAB Ect Mult Living   2 2 2              Review of Systems  Constitutional: Positive for chills. Negative for fever.  Cardiovascular: Positive for syncope.  Neurological: Positive for syncope and headaches.  All other systems reviewed and are negative.    Allergies  Penicillins  Home Medications   Current Outpatient Rx  Name  Route  Sig  Dispense  Refill  . aspirin EC 81 MG  tablet   Oral   Take 81 mg by mouth daily.         . cetirizine (ZYRTEC) 10 MG chewable tablet   Oral   Chew 10 mg by mouth daily.         . folic acid (FOLVITE) 400 MCG tablet   Oral   Take 400 mcg by mouth daily.         Marland Kitchen levETIRAcetam (KEPPRA) 500 MG tablet   Oral   Take 500 mg by mouth 2 (two) times daily.         Marland Kitchen lisinopril (PRINIVIL,ZESTRIL) 10 MG tablet   Oral   Take 10 mg by mouth 2 (two) times daily.         . Multiple Vitamin (MULTIVITAMIN WITH MINERALS) TABS   Oral   Take 1 tablet by mouth daily.         . naproxen sodium (ANAPROX) 220 MG tablet   Oral   Take 220 mg by mouth every 8 (eight) hours as needed. For pain         . Potassium Gluconate 595 MG CAPS   Oral   Take 595 mg by mouth 2 (two) times daily.         . simvastatin (ZOCOR) 40 MG tablet   Oral   Take 40 mg by mouth every evening.           BP 151/72  Pulse 92  Temp(Src) 99.2 F (37.3 C) (Oral)  Ht 5\' 7"  (1.702 m)  Wt 174 lb (78.926 kg)  BMI 27.25 kg/m2  SpO2 96%  Physical Exam  Nursing note and vitals reviewed. Constitutional: She is oriented to person, place, and time. She appears well-developed and well-nourished. No distress.  HENT:  Head: Normocephalic and atraumatic.  Eyes: Conjunctivae and EOM are normal. Pupils are equal, round, and reactive to light. Right eye exhibits no discharge. Left eye exhibits no discharge. No scleral icterus.  Neck: Normal range of motion. Neck supple. No JVD present. No tracheal deviation present. No thyromegaly present.  Cardiovascular: Normal rate, regular rhythm and intact distal pulses.  Exam reveals no gallop.   No murmur heard. Pulmonary/Chest: Effort normal. No respiratory distress. She has no wheezes. She exhibits no tenderness.  Decreased air movement throughout Crackles in left base    Abdominal: Soft. Bowel sounds are normal. She exhibits no distension and no mass. There is no tenderness. There is no rebound and no  guarding.  Neurological: She is alert and oriented to person, place, and time. She has normal reflexes. No cranial nerve deficit. Coordination normal.  Bilateral upper and lower extremity strength nl  Skin: Skin is  warm and dry.  Psychiatric: She has a normal mood and affect. Her behavior is normal. Judgment and thought content normal.    ED Course  Procedures (including critical care time) DIAGNOSTIC STUDIES: Oxygen Saturation is 96% on [place], adequate by my interpretation.    COORDINATION OF CARE: 8:10 AM Discussed ED treatment with pt and pt agrees.   Results for orders placed during the hospital encounter of 11/25/12  CBC WITH DIFFERENTIAL      Result Value Range   WBC 6.7  4.0 - 10.5 K/uL   RBC 5.13 (*) 3.87 - 5.11 MIL/uL   Hemoglobin 15.6 (*) 12.0 - 15.0 g/dL   HCT 25.9  56.3 - 87.5 %   MCV 86.2  78.0 - 100.0 fL   MCH 30.4  26.0 - 34.0 pg   MCHC 35.3  30.0 - 36.0 g/dL   RDW 64.3  32.9 - 51.8 %   Platelets 218  150 - 400 K/uL   Neutrophils Relative % 78 (*) 43 - 77 %   Neutro Abs 5.2  1.7 - 7.7 K/uL   Lymphocytes Relative 13  12 - 46 %   Lymphs Abs 0.8  0.7 - 4.0 K/uL   Monocytes Relative 10  3 - 12 %   Monocytes Absolute 0.6  0.1 - 1.0 K/uL   Eosinophils Relative 0  0 - 5 %   Eosinophils Absolute 0.0  0.0 - 0.7 K/uL   Basophils Relative 0  0 - 1 %   Basophils Absolute 0.0  0.0 - 0.1 K/uL  COMPREHENSIVE METABOLIC PANEL      Result Value Range   Sodium 137  135 - 145 mEq/L   Potassium 3.8  3.5 - 5.1 mEq/L   Chloride 97  96 - 112 mEq/L   CO2 28  19 - 32 mEq/L   Glucose, Bld 139 (*) 70 - 99 mg/dL   BUN 8  6 - 23 mg/dL   Creatinine, Ser 8.41  0.50 - 1.10 mg/dL   Calcium 9.4  8.4 - 66.0 mg/dL   Total Protein 7.9  6.0 - 8.3 g/dL   Albumin 4.0  3.5 - 5.2 g/dL   AST 33  0 - 37 U/L   ALT 31  0 - 35 U/L   Alkaline Phosphatase 82  39 - 117 U/L   Total Bilirubin 0.4  0.3 - 1.2 mg/dL   GFR calc non Af Amer 81 (*) >90 mL/min   GFR calc Af Amer >90  >90 mL/min  URINALYSIS,  ROUTINE W REFLEX MICROSCOPIC      Result Value Range   Color, Urine YELLOW  YELLOW   APPearance CLEAR  CLEAR   Specific Gravity, Urine 1.020  1.005 - 1.030   pH 6.0  5.0 - 8.0   Glucose, UA NEGATIVE  NEGATIVE mg/dL   Hgb urine dipstick TRACE (*) NEGATIVE   Bilirubin Urine NEGATIVE  NEGATIVE   Ketones, ur NEGATIVE  NEGATIVE mg/dL   Protein, ur 630 (*) NEGATIVE mg/dL   Urobilinogen, UA 0.2  0.0 - 1.0 mg/dL   Nitrite NEGATIVE  NEGATIVE   Leukocytes, UA NEGATIVE  NEGATIVE  LACTIC ACID, PLASMA      Result Value Range   Lactic Acid, Venous 1.9  0.5 - 2.2 mmol/L  URINE MICROSCOPIC-ADD ON      Result Value Range   RBC / HPF 0-2  <3 RBC/hpf  POCT I-STAT TROPONIN I      Result Value Range   Troponin i, poc  0.00  0.00 - 0.08 ng/mL   Comment 3            Dg Chest 1 View  11/25/2012   *RADIOLOGY REPORT*  Clinical Data: Cough, fever  CHEST - 1 VIEW  Comparison: August 24, 2012.  Findings: Cardiomediastinal silhouette appears normal.  No acute pulmonary disease is noted.  Bony thorax is intact.  Hyperinflation of the lungs is again noted and unchanged compared to prior exam.  IMPRESSION: No acute cardiopulmonary abnormality seen.   Original Report Authenticated By: Lupita Raider.,  M.D.   Ct Head Wo Contrast  11/25/2012   *RADIOLOGY REPORT*  Clinical Data: Unsteady gait.  Nausea.  Diabetic.  Hypertension. History seizures.  CT HEAD WITHOUT CONTRAST  Technique:  Contiguous axial images were obtained from the base of the skull through the vertex without contrast.  Comparison: 06/04/2009.  Findings: No intracranial hemorrhage.  Right middle cerebral artery branch vessel appears slightly dense new from prior examination.  Small thrombus not excluded.  Streak artifact extends through the posterior fossa structures.  No obvious large posterior fossa infarct.  Prominent small vessel disease type changes.  Global atrophy without hydrocephalus.  No intracranial mass lesion detected on this unenhanced exam.   IMPRESSION: No intracranial hemorrhage.  Right middle cerebral artery branch vessel appears slightly dense new from prior examination.  Small thrombus not excluded.  Streak artifact extends through the posterior fossa structures.  No obvious large posterior fossa infarct.  Prominent small vessel disease type changes.   Original Report Authenticated By: Lacy Duverney, M.D.       No results found.   No diagnosis found.   Date: 11/25/2012  Rate: 89  Rhythm: normal sinus rhythm  QRS Axis: normal  Intervals: normal  ST/T Wave abnormalities: nonspecific ST/T changes  Conduction Disutrbances:right bundle branch block  Narrative Interpretation:   Old EKG Reviewed: unchanged   Filed Vitals:   11/25/12 0745  BP:   Pulse:   Temp: 99.2 F (37.3 C)   Filed Vitals:   11/25/12 0842  BP: 139/54  Pulse: 71  Temp:    Filed Vitals:   11/25/12 0838 11/25/12 0839 11/25/12 0840 11/25/12 0842  BP: 134/57  130/73 139/54  Pulse: 84  88 71  Temp:  100.4 F (38 C)    TempSrc:  Rectal    Height:      Weight:      SpO2:         MDM  Patient  presents today with chills and syncopal episode with possible respiratory arrest. Patient with fever here- she is a smoker but has no acute infiltrate on cxr.  Patient treated with rocephin and vancomycin to cover probable respiratory and urine pathogens.  Patient with normal lactate and bp normal.  Head ct due to headache, although patient reports no significant change from baseline headaches, with abnormality which will require further evaluation with mri.        I personally performed the services described in this documentation, which was scribed in my presence. The recorded information has been reviewed and considered.   Discussed with Dr. Irene Limbo and he will admit to telemetry bed.  Temp admitting orders placed.   [name], MD 11/25/12 1016

## 2012-11-25 NOTE — ED Notes (Signed)
Good[place], MD at bedside. 

## 2012-11-25 NOTE — ED Notes (Signed)
Orthostatic vital signs completed on patient. Pt became slightly hypertensive while standing with fast respirations at 28 BPM. Pt did have an unsteady gait while standing. Pt c/o of no pain or dizziness. Pt states that she just feels tired. Pt made aware that a urine sample is needed. Pt was placed on a bedpan, but unable to urinate at this time. Pt had urinated in bed prior to orthostatic vital signs. Sheets were changed and chux were placed under pt. RN made aware.

## 2012-11-26 DIAGNOSIS — E119 Type 2 diabetes mellitus without complications: Secondary | ICD-10-CM

## 2012-11-26 LAB — GLUCOSE, CAPILLARY
Glucose-Capillary: 109 mg/dL — ABNORMAL HIGH (ref 70–99)
Glucose-Capillary: 171 mg/dL — ABNORMAL HIGH (ref 70–99)

## 2012-11-26 LAB — LIPID PANEL
Cholesterol: 114 mg/dL (ref 0–200)
HDL: 38 mg/dL — ABNORMAL LOW
LDL Cholesterol: 48 mg/dL (ref 0–99)
Total CHOL/HDL Ratio: 3 ratio
Triglycerides: 141 mg/dL
VLDL: 28 mg/dL (ref 0–40)

## 2012-11-26 NOTE — Discharge Summary (Signed)
Physician Discharge Summary  Shannon Maldonado:[id number] DOB: 03/21/1936 DOA: 11/25/2012  PCP: Shannon Ruths, MD  Admit date: 11/25/2012 Discharge date: 11/26/2012  Recommendations for Outpatient Follow-up:  1. Followup syncope  2. Continue to encourage smoking cessation 3. Asymptomatic carotid stenosis  Follow-up Information   Follow up with Shannon Ruths, MD In 1 week.   Contact information:   [address] [place] [address] [phone]      Discharge Diagnoses:  1. Syncope 2. History of possible seizures 3. Hypertension 4. Diabetes mellitus type 2 5. Cigarette smoker 6. Remote history of closed head injury  Discharge Condition: Improved Disposition: Home  Diet recommendation: Heart healthy diabetic diet  Filed Weights   11/25/12 0730 11/25/12 1150  Weight: 78.926 kg (174 lb) 76.431 kg (168 lb 8 oz)    History of present illness:  77 year old woman presented to the emergency department with a history of syncope, headache, possible right leg weakness. Initial exam was unremarkable as were screening laboratory studies but CT of the head was nonspecific the patient was referred for observation.  Hospital Course:  Shannon Maldonado was admitted for further evaluation of syncope and abnormal head CT. She had no recurrent episodes of syncope, feels well and has ambulated without difficulty. She is not felt to require physical therapy consultation. 2-D echocardiogram was unremarkable as were carotid Dopplers. Telemetry was unremarkable. Noncontrast CT of the head was negative for hemorrhage but could not exclude small thrombus in an MCA branch. Patient has claustrophobia and refused closed MRI. Therefore CTA of the head was pursued which was unremarkable. The patient had no focal signs or symptoms to suggest a stroke and requires no further evaluation. She was never TPA candidate given the lack of objective findings.  1. Syncope: No recurrence.  History is most suggestive of vasovagal syncope this patient was standing at the time of the event and was kept in a seated position. There is no clear history of respiratory arrest or cardiac arrest. Urinary incontinence did happen prior to the event however patient was not postictal and there was no seizure-like activity. No objective evidence to suggest cardiac event. There was a question of right leg weakness. This has not been objectively demonstrated and was not present immediately after the event per the son. She has done well and has not required physical therapy. CTA of the head as noted below did not reveal any abnormalities. TIA evaluation unremarkable. MRI will be deferred as the patient cannot tolerate a closed MRI. Patient was not a candidate for TPA regardless given lack of objective findings as well as delayed presentation. LDL 48. 2. Remote history of closed head injury: She has ongoing followup at [facility].  3. Possible history of seizures: The patient is on Keppra, however her son is not feel that she has never had a true seizure.  4. Hypertension: Stable.  5. Diabetes mellitus type 2, diet controlled: Stable. Sliding-scale insulin.  6. Cigarette smoker: I counseled cessation but the patient has no interest.   Review of records [facility]:  04/16/2012: Patient seen in neurology outpatient clinic for possible complex partial seizures in 1989 characterized as transient confusional state. Patient doing well on Keppra. There is reasonable suspicion spells are nonepileptic in origin. Assessment: "Spells"--MRI of the brain was planned, continue Keppra, cardiac evaluation per PCP.   Discharge Instructions  Discharge Orders   Future Orders Complete By Expires     Activity as tolerated - No restrictions  As  directed     Diet - low sodium heart healthy  As directed     Diet Carb Modified  As directed     Discharge instructions  As directed     Comments:      Call your physician or  seek immediate medical attention for passing out, weakness, difficulty speaking or worsening of your condition.        Medication List    TAKE these medications       aspirin EC 81 MG tablet  Take 81 mg by mouth daily.     cetirizine 10 MG tablet  Commonly known as:  ZYRTEC  Take 10 mg by mouth daily.     folic acid 400 MCG tablet  Commonly known as:  FOLVITE  Take 400 mcg by mouth daily.     levETIRAcetam 500 MG tablet  Commonly known as:  KEPPRA  Take 500 mg by mouth 2 (two) times daily.     lisinopril 10 MG tablet  Commonly known as:  PRINIVIL,ZESTRIL  Take 10 mg by mouth 2 (two) times daily.     multivitamin with minerals Tabs  Take 1 tablet by mouth daily.     naproxen sodium 220 MG tablet  Commonly known as:  ANAPROX  Take 220 mg by mouth every 8 (eight) hours as needed. For pain     OPTIVE 0.5-0.9 % Soln  Generic drug:  Carboxymethylcellul-Glycerin  Apply 2 drops to eye daily.     Potassium Gluconate 595 MG Caps  Take 595 mg by mouth 2 (two) times daily.     simvastatin 40 MG tablet  Commonly known as:  ZOCOR  Take 40 mg by mouth every evening.       Allergies  Allergen Reactions  . Penicillins Swelling    The results of significant diagnostics from this hospitalization (including imaging, microbiology, ancillary and laboratory) are listed below for reference.    Significant Diagnostic Studies: Ct Angio Head W/cm &/or Wo Cm  11/25/2012   *RADIOLOGY REPORT*  Clinical Data:  77 year old female with syncope, unsteady gait. Diabetes.  CT ANGIOGRAPHY HEAD  Technique:  Multidetector CT imaging of the head was performed using the standard protocol during bolus administration of intravenous contrast.  Multiplanar CT image reconstructions including MIPs were obtained to evaluate the vascular anatomy.  Contrast: 75mL OMNIPAQUE IOHEXOL 350 MG/ML SOLN  Comparison:  Head CTs without contrast 0926 hours the same day and earlier.  Findings:  Stable CT appearance of the  brain parenchyma. No abnormal enhancement identified. Stable visualized osseous structures.  Stable paranasal sinuses, mastoids, orbits soft tissues and scalp soft tissues.  Vascular Findings: Major intracranial venous structures appear to remain patent.  Codominant distal vertebral arteries are patent.  Normal PICA origins.  Patent vertebrobasilar junction.  No basilar stenosis. SCA and PCA origins are within normal limits.  Right PCA branches are within normal limits.  Posterior communicating arteries are diminutive or absent.  There is a moderate focal stenosis of the left PCA distal P1 proximal P2 segment (series 605 image 59).  The left PCA remains patent.  There may be a second area of stenosis of the left PCA bifurcation (series 607 image 101) but the major left PCA branches remain patent.  The the distal cervical ICA is are within normal limits.  Both ICA siphons are patent.  There is mild to moderate right ICA anterior genu calcified atherosclerosis.  This does not not appear to be hemodynamically significant.  Ophthalmic artery origins and  supraclinoid ICA segments are within normal limits.  Carotid termini, MCA and ACA origins are within normal limits.  Diminutive anterior communicating artery.  Bilateral ACA branches are within normal limits.  MCA M1 segments are within normal limits.  The bilateral MCA branches are within normal limits.   Review of the MIP images confirms the above findings.  IMPRESSION: 1.  Moderate multifocal stenosis of the left PCA with preserved distal PCA branch flow.  No CT evidence of acute left PCA infarct identified, and stable CT appearance of brain parenchyma from earlier. 2.  Mild for age anterior circulation atherosclerosis.   Original Report Authenticated By: Erskine Speed, M.D.   Dg Chest 1 View  11/25/2012   *RADIOLOGY REPORT*  Clinical Data: Cough, fever  CHEST - 1 VIEW  Comparison: August 24, 2012.  Findings: Cardiomediastinal silhouette appears normal.  No acute  pulmonary disease is noted.  Bony thorax is intact.  Hyperinflation of the lungs is again noted and unchanged compared to prior exam.  IMPRESSION: No acute cardiopulmonary abnormality seen.   Original Report Authenticated By: Lupita Raider.,  M.D.   Ct Head Wo Contrast  11/25/2012   *RADIOLOGY REPORT*  Clinical Data: Unsteady gait.  Nausea.  Diabetic.  Hypertension. History seizures.  CT HEAD WITHOUT CONTRAST  Technique:  Contiguous axial images were obtained from the base of the skull through the vertex without contrast.  Comparison: 06/04/2009.  Findings: No intracranial hemorrhage.  Right middle cerebral artery branch vessel appears slightly dense new from prior examination.  Small thrombus not excluded.  Streak artifact extends through the posterior fossa structures.  No obvious large posterior fossa infarct.  Prominent small vessel disease type changes.  Global atrophy without hydrocephalus.  No intracranial mass lesion detected on this unenhanced exam.  IMPRESSION: No intracranial hemorrhage.  Right middle cerebral artery branch vessel appears slightly dense new from prior examination.  Small thrombus not excluded.  Streak artifact extends through the posterior fossa structures.  No obvious large posterior fossa infarct.  Prominent small vessel disease type changes.   Original Report Authenticated By: Lacy Duverney, M.D.   US Carotid Duplex Bilateral  11/25/2012   *RADIOLOGY REPORT*  Clinical Data: Syncope  BILATERAL CAROTID DUPLEX ULTRASOUND  Technique: Wallace Cullens scale imaging, color Doppler and duplex ultrasound was performed of bilateral carotid and vertebral arteries in the neck.  Comparison:  CT scan of the head obtained earlier today at 09:26 a.m.  Criteria:  Quantification of carotid stenosis is based on velocity parameters that correlate the residual internal carotid diameter with NASCET-based stenosis levels, using the diameter of the distal internal carotid lumen as the denominator for stenosis  measurement.  The following velocity measurements were obtained:                   PEAK SYSTOLIC/END DIASTOLIC RIGHT ICA:                        165/13cm/sec CCA:                        87/8cm/sec SYSTOLIC ICA/CCA RATIO:     1.9 DIASTOLIC ICA/CCA RATIO:    1.7 ECA:                        154cm/sec  LEFT ICA:  131/14cm/sec CCA:                        91/10cm/sec SYSTOLIC ICA/CCA RATIO:     1.4 DIASTOLIC ICA/CCA RATIO:    1.4 ECA:                        228cm/sec  Findings:  RIGHT CAROTID ARTERY: Intimal medial thickening noted in the common carotid artery.  Heterogeneous atherosclerotic plaque is noted in the distal common carotid artery, carotid bulb and proximal internal carotid artery.  Calcified atherosclerotic plaque in the proximal internal carotid artery results in 50 - 69% estimated diameter stenosis.  RIGHT VERTEBRAL ARTERY:  Patent with normal antegrade flow.  LEFT CAROTID ARTERY: Intimal medial thickening noted in the distal common carotid artery.  There is mild heterogeneous atherosclerotic plaque in the distal common carotid artery extending into the proximal internal and external carotid arteries.  Focal narrowing and elevated peak systolic velocity in the proximal external carotid artery likely results in greater than 50% diameter stenosis.  Elevated peak systolic velocity in the distal left internal carotid artery may be exaggerated.  LEFT VERTEBRAL ARTERY:  Patent with normal antegrade flow.  IMPRESSION:  1.  Heterogeneous partially calcified plaque in the proximal right internal carotid artery results in 50 - 69% diameter stenosis.  2.  Mild heterogeneous plaque on the left results in elevated peak systolic velocities in the distal internal carotid artery, however there is no significant plaque in the region of the elevated velocity.  Diameter stenosis is likely less than 50%.  3.  Greater than 50% diameter stenosis of the proximal left external carotid artery  4.  Vertebral  arteries are patent with antegrade flow.  Signed,  Sterling Big, MD Vascular & Interventional Radiologist [facility] Radiology   Original Report Authenticated By: Malachy Moan, M.D.    Labs: Basic Metabolic Panel:  Recent Labs Lab 11/25/12 0821  NA 137  K 3.8  CL 97  CO2 28  GLUCOSE 139*  BUN 8  CREATININE 0.75  CALCIUM 9.4   Liver Function Tests:  Recent Labs Lab 11/25/12 0821  AST 33  ALT 31  ALKPHOS 82  BILITOT 0.4  PROT 7.9  ALBUMIN 4.0   CBC:  Recent Labs Lab 11/25/12 0821  WBC 6.7  NEUTROABS 5.2  HGB 15.6*  HCT 44.2  MCV 86.2  PLT 218   CBG:  Recent Labs Lab 11/25/12 1202 11/25/12 1701 11/25/12 2128 11/26/12 0730 11/26/12 1235  GLUCAP 121* 118* 117* 109* 171*    Principal Problem:   Syncope Active Problems:   Abnormal head CT   History of closed head injury   Seizure   DM type 2 (diabetes mellitus, type 2)   Cigarette smoker   Time coordinating discharge: 25 minutes  Signed:  Brendia Sacks, MD [facility] 11/26/2012, 2:08 PM

## 2012-11-26 NOTE — Progress Notes (Signed)
Utilization Review Complete  

## 2012-11-26 NOTE — Progress Notes (Signed)
Patient discharged today with instructions given on medications,and follow up visits,patient ,and family verbalized understanding.No c/o pain or discomfort noted. Patient ambulated in hallway without difficulty.Accompanied by staff to an awaiting vehicle.

## 2012-11-26 NOTE — Progress Notes (Signed)
[facility] PROGRESS NOTE  RAYMONA BOSS ZOX:[id number] DOB: 09/12/1935 DOA: 11/25/2012 PCP: Kirk Ruths, MD  Assessment/Plan: 1. Syncope: No recurrence. History is most suggestive of vasovagal syncope this patient was standing at the time of the event and was kept in a seated position. There is no clear history of respiratory arrest or cardiac arrest. Urinary incontinence did happen prior to the event however patient was not postictal and there was no seizure-like activity. No objective evidence to suggest cardiac event. There was a question of right leg weakness. This has not been objectively demonstrated and was not present immediately after the event per the son. She has done well and has not required physical therapy. CTA of the head as noted below did not reveal any abnormalities. TIA evaluation unremarkable. MRI will be deferred as the patient cannot tolerate a closed MRI. Patient was not a candidate for TPA regardless given lack of objective findings as well as delayed presentation. LDL 48. 2. Remote history of closed head injury: She has ongoing followup at [facility].  3. Possible history of seizures: The patient is on Keppra, however her son is not feel that she has never had a true seizure.  4. Hypertension: Stable.  5. Diabetes mellitus type 2, diet controlled: Stable. Sliding-scale insulin.  6. Cigarette smoker: I counseled cessation but the patient has no interest.  Review of records [facility]: 04/16/2012: Patient seen in neurology outpatient clinic for possible complex partial seizures in 1989 characterized as transient confusional state. Patient doing well on Keppra. There is reasonable suspicion spells are nonepileptic in origin. Assessment: "Spells"--MRI of the brain was planned, continue Keppra, cardiac evaluation per PCP.  Code Status: Full code Family Communication: None present Disposition Plan: Home  Brendia Sacks, MD  [facility]  Pager  [phone] If 7PM-7AM, please contact night-coverage at [email or web address], password [facility] 11/26/2012, 1:57 PM  LOS: 1 day   Clinical Summary: 77 year old woman presented to the emergency department with a history of syncope, headache, possible right leg weakness. Initial exam was unremarkable as were screening laboratory studies but CT of the head was nonspecific the patient was referred for observation.  Consultants:  None  Procedures:  2-D echocardiogram left ventricular ejection fraction 65 and 70%. Normal wall motion. Grade 1 diastolic dysfunction.  HPI/Subjective: Feels better. No complaints. No syncope, no focal motor neurologic deficits. Feels well. Has been up to the bathroom with assistance.  Objective: Filed Vitals:   11/25/12 1740 11/25/12 2126 11/26/12 0149 11/26/12 0600  BP: 163/57 174/80 142/55 132/84  Pulse: 80 82 85 83  Temp: 98.7 F (37.1 C) 100 F (37.8 C) 99.1 F (37.3 C) 98.4 F (36.9 C)  TempSrc: Oral Oral Oral Oral  Resp: 20 18 20 20   Height:      Weight:      SpO2: 100% 96% 95% 96%    Intake/Output Summary (Last 24 hours) at 11/26/12 1357 Last data filed at 11/25/12 1900  Gross per 24 hour  Intake    550 ml  Output    600 ml  Net    -50 ml     Filed Weights   11/25/12 0730 11/25/12 1150  Weight: 78.926 kg (174 lb) 76.431 kg (168 lb 8 oz)    Exam:  General:  Appears calm and comfortable. Very alert and mentally clear. Cardiovascular: RRR, no m/r/g. No LE edema. Telemetry: SR, PVCs Respiratory: CTA bilaterally, no w/r/r. Normal respiratory effort. Musculoskeletal: grossly normal tone BUE/BLE. Strength grossly normal. No upper extremity dysdiadochokinesis.  Psychiatric: grossly normal mood and affect, speech fluent and appropriate Neurologic: Cranial nerves appear intact  Data Reviewed:  LDL 48. Capillary blood sugars stable. Hemoglobin A1c 5.8. Urinalysis negative. Urine drug screen negative. 2-D echocardiogram and carotid ultrasounds noted. CTA  of the head showed normal bilateral MCA branches. No evidence of stroke. Stenosis of left PCA was seen.  Pending studies:  None   Scheduled Meds: . aspirin  325 mg Oral Daily  . enoxaparin (LOVENOX) injection  40 mg Subcutaneous Daily  . insulin aspart  0-9 Units Subcutaneous TID WC  . levETIRAcetam  500 mg Oral BID  . loratadine  10 mg Oral Daily  . nicotine  14 mg Transdermal Daily  . simvastatin  40 mg Oral QPM  . sodium chloride  3 mL Intravenous Q12H   Continuous Infusions:   Principal Problem:   Syncope Active Problems:   Abnormal head CT   History of closed head injury   Seizure   DM type 2 (diabetes mellitus, type 2)   Cigarette smoker

## 2012-11-26 NOTE — Progress Notes (Signed)
PT Cancellation Note  Patient Details Name: Shannon Maldonado MRN: [id number] DOB: 1936-01-11   Cancelled Treatment:    Reason Eval/Treat Not Completed: Other (comment) Pt declines PT eval.  She states that she is doing very well and doesn't need any PT.  RN states that she  Had pt walk in the room earlier and pt had no instability.  We will d/c orders.  Myrlene Broker L 11/26/2012, 1:58 PM

## 2012-11-27 ENCOUNTER — Inpatient Hospital Stay (HOSPITAL_COMMUNITY)
Admission: EM | Admit: 2012-11-27 | Discharge: 2012-11-29 | DRG: 101 | Disposition: A | Discharge status: Home Health | Payer: Medicare Other | Attending: Internal Medicine | Admitting: Internal Medicine

## 2012-11-27 ENCOUNTER — Encounter (HOSPITAL_COMMUNITY): Payer: Self-pay | Admitting: Emergency Medicine

## 2012-11-27 ENCOUNTER — Emergency Department (HOSPITAL_COMMUNITY): Payer: Medicare Other

## 2012-11-27 DIAGNOSIS — I2510 Atherosclerotic heart disease of native coronary artery without angina pectoris: Secondary | ICD-10-CM | POA: Diagnosis present

## 2012-11-27 DIAGNOSIS — Z860100 Personal history of colon polyps, unspecified: Secondary | ICD-10-CM

## 2012-11-27 DIAGNOSIS — G40909 Epilepsy, unspecified, not intractable, without status epilepticus: Principal | ICD-10-CM | POA: Diagnosis present

## 2012-11-27 DIAGNOSIS — F329 Major depressive disorder, single episode, unspecified: Secondary | ICD-10-CM | POA: Diagnosis present

## 2012-11-27 DIAGNOSIS — Z79899 Other long term (current) drug therapy: Secondary | ICD-10-CM

## 2012-11-27 DIAGNOSIS — Z7982 Long term (current) use of aspirin: Secondary | ICD-10-CM

## 2012-11-27 DIAGNOSIS — E872 Acidosis, unspecified: Secondary | ICD-10-CM | POA: Diagnosis present

## 2012-11-27 DIAGNOSIS — R55 Syncope and collapse: Secondary | ICD-10-CM

## 2012-11-27 DIAGNOSIS — E876 Hypokalemia: Secondary | ICD-10-CM | POA: Diagnosis present

## 2012-11-27 DIAGNOSIS — Z7902 Long term (current) use of antithrombotics/antiplatelets: Secondary | ICD-10-CM

## 2012-11-27 DIAGNOSIS — F0390 Unspecified dementia without behavioral disturbance: Secondary | ICD-10-CM | POA: Diagnosis present

## 2012-11-27 DIAGNOSIS — Z87820 Personal history of traumatic brain injury: Secondary | ICD-10-CM

## 2012-11-27 DIAGNOSIS — F17210 Nicotine dependence, cigarettes, uncomplicated: Secondary | ICD-10-CM | POA: Diagnosis present

## 2012-11-27 DIAGNOSIS — E119 Type 2 diabetes mellitus without complications: Secondary | ICD-10-CM | POA: Diagnosis present

## 2012-11-27 DIAGNOSIS — I10 Essential (primary) hypertension: Secondary | ICD-10-CM | POA: Diagnosis present

## 2012-11-27 DIAGNOSIS — F17200 Nicotine dependence, unspecified, uncomplicated: Secondary | ICD-10-CM | POA: Diagnosis present

## 2012-11-27 DIAGNOSIS — R569 Unspecified convulsions: Secondary | ICD-10-CM | POA: Diagnosis present

## 2012-11-27 DIAGNOSIS — M129 Arthropathy, unspecified: Secondary | ICD-10-CM | POA: Diagnosis present

## 2012-11-27 DIAGNOSIS — R930 Abnormal findings on diagnostic imaging of skull and head, not elsewhere classified: Secondary | ICD-10-CM

## 2012-11-27 DIAGNOSIS — E860 Dehydration: Secondary | ICD-10-CM

## 2012-11-27 DIAGNOSIS — E871 Hypo-osmolality and hyponatremia: Secondary | ICD-10-CM | POA: Diagnosis present

## 2012-11-27 DIAGNOSIS — F3289 Other specified depressive episodes: Secondary | ICD-10-CM | POA: Diagnosis present

## 2012-11-27 DIAGNOSIS — E785 Hyperlipidemia, unspecified: Secondary | ICD-10-CM | POA: Diagnosis present

## 2012-11-27 HISTORY — DX: Type 2 diabetes mellitus without complications: E11.9

## 2012-11-27 HISTORY — DX: Syncope and collapse: R55

## 2012-11-27 HISTORY — DX: Major depressive disorder, single episode, unspecified: F32.9

## 2012-11-27 HISTORY — DX: Shortness of breath: R06.02

## 2012-11-27 HISTORY — DX: Respiratory arrest: R09.2

## 2012-11-27 HISTORY — DX: Other chronic pain: G89.29

## 2012-11-27 HISTORY — DX: Depression, unspecified: F32.A

## 2012-11-27 HISTORY — DX: Low back pain: M54.5

## 2012-11-27 HISTORY — DX: Unspecified osteoarthritis, unspecified site: M19.90

## 2012-11-27 HISTORY — DX: Low back pain, unspecified: M54.50

## 2012-11-27 LAB — URINE MICROSCOPIC-ADD ON

## 2012-11-27 LAB — POCT I-STAT TROPONIN I

## 2012-11-27 LAB — CBC WITH DIFFERENTIAL/PLATELET
Basophils Absolute: 0.2 10*3/uL — ABNORMAL HIGH (ref 0.0–0.1)
Eosinophils Relative: 0 % (ref 0–5)
Lymphocytes Relative: 21 % (ref 12–46)
Monocytes Relative: 6 % (ref 3–12)
Neutrophils Relative %: 70 % (ref 43–77)
Platelets: 176 10*3/uL (ref 150–400)
RBC: 4.59 MIL/uL (ref 3.87–5.11)
RDW: 13.4 % (ref 11.5–15.5)
WBC: 6.5 10*3/uL (ref 4.0–10.5)

## 2012-11-27 LAB — URINALYSIS, ROUTINE W REFLEX MICROSCOPIC
Hgb urine dipstick: NEGATIVE
Leukocytes, UA: NEGATIVE
Nitrite: NEGATIVE
Specific Gravity, Urine: 1.022 (ref 1.005–1.030)
Urobilinogen, UA: 0.2 mg/dL (ref 0.0–1.0)

## 2012-11-27 LAB — COMPREHENSIVE METABOLIC PANEL
ALT: 42 U/L — ABNORMAL HIGH (ref 0–35)
AST: 43 U/L — ABNORMAL HIGH (ref 0–37)
Albumin: 3.4 g/dL — ABNORMAL LOW (ref 3.5–5.2)
CO2: 26 mEq/L (ref 19–32)
Chloride: 94 mEq/L — ABNORMAL LOW (ref 96–112)
GFR calc non Af Amer: 64 mL/min — ABNORMAL LOW (ref >90)
Sodium: 132 mEq/L — ABNORMAL LOW (ref 135–145)
Total Bilirubin: 0.4 mg/dL (ref 0.3–1.2)

## 2012-11-27 MED ORDER — LISINOPRIL 20 MG PO TABS
20.0000 mg | ORAL_TABLET | Freq: Every day | ORAL | Status: DC
  Administered 2012-11-27 – 2012-11-29 (×3): 20 mg via ORAL
  Filled 2012-11-27 (×3): qty 1

## 2012-11-27 MED ORDER — ENOXAPARIN SODIUM 40 MG/0.4ML ~~LOC~~ SOLN
40.0000 mg | SUBCUTANEOUS | Status: DC
  Administered 2012-11-27 – 2012-11-28 (×2): 40 mg via SUBCUTANEOUS
  Filled 2012-11-27 (×3): qty 0.4

## 2012-11-27 MED ORDER — INSULIN ASPART 100 UNIT/ML ~~LOC~~ SOLN
0.0000 [IU] | Freq: Three times a day (TID) | SUBCUTANEOUS | Status: DC
  Administered 2012-11-28: 1 [IU] via SUBCUTANEOUS

## 2012-11-27 MED ORDER — POLYVINYL ALCOHOL 1.4 % OP SOLN
2.0000 [drp] | Freq: Every day | OPHTHALMIC | Status: DC | PRN
  Filled 2012-11-27: qty 15

## 2012-11-27 MED ORDER — SODIUM CHLORIDE 0.9 % IV SOLN
INTRAVENOUS | Status: AC
  Administered 2012-11-27: 19:00:00 via INTRAVENOUS

## 2012-11-27 MED ORDER — NICOTINE 14 MG/24HR TD PT24
14.0000 mg | MEDICATED_PATCH | Freq: Every day | TRANSDERMAL | Status: DC
  Administered 2012-11-27 – 2012-11-29 (×3): 14 mg via TRANSDERMAL
  Filled 2012-11-27 (×3): qty 1

## 2012-11-27 MED ORDER — LEVETIRACETAM 500 MG PO TABS
500.0000 mg | ORAL_TABLET | Freq: Two times a day (BID) | ORAL | Status: DC
  Administered 2012-11-27 – 2012-11-29 (×4): 500 mg via ORAL
  Filled 2012-11-27 (×5): qty 1

## 2012-11-27 MED ORDER — SODIUM CHLORIDE 0.9 % IV SOLN
INTRAVENOUS | Status: DC
  Administered 2012-11-27: 11:00:00 via INTRAVENOUS

## 2012-11-27 MED ORDER — CLOPIDOGREL BISULFATE 75 MG PO TABS
75.0000 mg | ORAL_TABLET | Freq: Every day | ORAL | Status: DC
  Administered 2012-11-28 – 2012-11-29 (×2): 75 mg via ORAL
  Filled 2012-11-27 (×4): qty 1

## 2012-11-27 MED ORDER — LORAZEPAM BOLUS VIA INFUSION
1.0000 mg | Freq: Once | INTRAVENOUS | Status: DC
  Filled 2012-11-27: qty 1

## 2012-11-27 MED ORDER — SODIUM CHLORIDE 0.9 % IV SOLN: INTRAVENOUS | Status: DC

## 2012-11-27 MED ORDER — ADULT MULTIVITAMIN W/MINERALS CH
1.0000 | ORAL_TABLET | Freq: Every day | ORAL | Status: DC
  Administered 2012-11-27 – 2012-11-29 (×3): 1 via ORAL
  Filled 2012-11-27 (×3): qty 1

## 2012-11-27 MED ORDER — CARBOXYMETHYLCELLUL-GLYCERIN 0.5-0.9 % OP SOLN: 2.0000 [drp] | Freq: Every day | OPHTHALMIC | Status: DC | PRN

## 2012-11-27 MED ORDER — POTASSIUM CHLORIDE CRYS ER 20 MEQ PO TBCR
40.0000 meq | EXTENDED_RELEASE_TABLET | ORAL | Status: AC
  Administered 2012-11-27 – 2012-11-28 (×3): 40 meq via ORAL
  Filled 2012-11-27 (×3): qty 2

## 2012-11-27 MED ORDER — LORATADINE 10 MG PO TABS
10.0000 mg | ORAL_TABLET | Freq: Every day | ORAL | Status: DC
  Administered 2012-11-27 – 2012-11-29 (×3): 10 mg via ORAL
  Filled 2012-11-27 (×3): qty 1

## 2012-11-27 MED ORDER — SIMVASTATIN 40 MG PO TABS
40.0000 mg | ORAL_TABLET | Freq: Every evening | ORAL | Status: DC
  Administered 2012-11-27 – 2012-11-28 (×2): 40 mg via ORAL
  Filled 2012-11-27 (×3): qty 1

## 2012-11-27 NOTE — Plan of Care (Signed)
Problem: Phase I Progression Outcomes Goal: Strict NPO til swallow screen done Outcome: Not Met (add Reason) Patient states that she was given food before admitting provider came and assessed her.

## 2012-11-27 NOTE — ED Notes (Signed)
Pt BIB son who states since seizure on Monday (seen and eval at [facility] then) pt has been weak. Unable to stand or perform ADLs as previously able. VSS. Pt alert, orientedx4, arrives via wheelchair. Placed on cardiac monitor.

## 2012-11-27 NOTE — Progress Notes (Signed)
Patient arrived to the unit safely without any issues. VS stables. She was transported to the bed without distress and placed on telemetry monitor. Family arrived to the unit with the patient. No skin issues noted or any pain verbalized. Patient was alert and oriented x 4. Awaiting Md to come and speak with the patient and family.

## 2012-11-27 NOTE — H&P (Signed)
[facility] History and Physical  Shannon Maldonado ZOX:[id number] DOB: 27-May-1936 DOA: 11/27/2012  Referring physician: Dr. Freida Busman PCP: Kirk Ruths, MD   Chief Complaint: weakness, ataxia and difficulty finding words.  HPI: Shannon Maldonado is a 77 y.o. female with PMH for DM, HTN, HLD, seizure disorder and tobacco abuse; came to ED complaining of sudden weakness sensation affecting her legs mainly, ataxia and difficulty finding words. Patient just discharged from [facility] with similar presentations. Today she was also found with hypokalemia, hyponatremia and signs of dehydration. Patient denies cough, chills, diarrhea, abdominal pain, hematemesis, melena and hematochezia.  Patient endorses decrease appetite, 1 episode of nausea/vomiting and generalized malaise.    Review of Systems:  Negative except as mentioned on HPI.  Past Medical History  Diagnosis Date  . Seizures   . Hypertension   . High cholesterol   . Coronary artery disease   . Diabetes mellitus   . Diverticula of colon   . Tubulovillous adenoma 07/13/2003  . H/O Clostridium difficile infection 2005  . Fatty liver    Past Surgical History  Procedure Laterality Date  . Abdominal hysterectomy    . Colonoscopy  07/23/2003    Dr. Jena Gauss- normal rectum, L side diverticula, tubulovillous adenoma  . Colonoscopy  09/26/2006    Dr. Jena Gauss- normal rectum, L side diverticula  . Orif right hip    . Appendectomy    . Bilateral cataract surg    . Colonoscopy  01/31/2012    Procedure: COLONOSCOPY;  Surgeon: Corbin Ade, MD;  [facility];  Service: Endoscopy;  Laterality: N/A;  12:00   Social History:  reports that she has been smoking Cigarettes.  She has a 25 pack-year smoking history. She does not have any smokeless tobacco history on file. She reports that she does not drink alcohol or use illicit drugs. Lives with son; require minimally assistance with ADL's  Allergies  Allergen Reactions  .  Penicillins Swelling    Family History  Problem Relation Age of Onset  . Heart attack Father   . Cancer Mother     Prior to Admission medications   Medication Sig Start Date End Date Taking? Authorizing Provider  aspirin EC 81 MG tablet Take 81 mg by mouth daily.   Yes Historical Provider, MD  Carboxymethylcellul-Glycerin (OPTIVE) 0.5-0.9 % SOLN Apply 2 drops to eye daily as needed (dryness/burning).    Yes Historical Provider, MD  cetirizine (ZYRTEC) 10 MG tablet Take 10 mg by mouth daily.   Yes Historical Provider, MD  fluticasone (CUTIVATE) 0.05 % cream Apply 1 application topically daily.   Yes Historical Provider, MD  folic acid (FOLVITE) 400 MCG tablet Take 400 mcg by mouth daily.   Yes Historical Provider, MD  levETIRAcetam (KEPPRA) 500 MG tablet Take 500 mg by mouth 2 (two) times daily.   Yes Historical Provider, MD  lisinopril (PRINIVIL,ZESTRIL) 10 MG tablet Take 10 mg by mouth 2 (two) times daily.   Yes Historical Provider, MD  Multiple Vitamin (MULTIVITAMIN WITH MINERALS) TABS Take 1 tablet by mouth daily.   Yes Historical Provider, MD  naproxen sodium (ANAPROX) 220 MG tablet Take 220 mg by mouth every 8 (eight) hours as needed. For pain   Yes Historical Provider, MD  Potassium Gluconate 595 MG CAPS Take 595 mg by mouth 2 (two) times daily.   Yes Historical Provider, MD  simvastatin (ZOCOR) 40 MG tablet Take 40 mg by mouth every evening.   Yes Historical Provider, MD  Physical Exam: Filed Vitals:   11/27/12 1151 11/27/12 1200 11/27/12 1300 11/27/12 1743  BP: 118/42 133/46 123/49 168/67  Pulse: 76 73 76 77  Temp:    98.5 F (36.9 C)  TempSrc:    Oral  Resp: 18   20  Height:    5\' 8"  (1.727 m)  Weight:    76.21 kg (168 lb 0.2 oz)  SpO2: 94% 95% 94% 93%     General:  AAOX4; currently w/o any acute complaints  Eyes: PERRL, no icterus or nystagmus  ENT: edentulous, tongue midline, no uvula deviation, no facial droop; dry MM; no erythema or exudates.   Neck: no JVD,  no bruits  Cardiovascular: S1 and S2, no rubs or gallops  Respiratory: scattered rhonchi, no wheezing  Abdomen: soft, NT, ND, positive BS  Skin: no rash or petechiae  Musculoskeletal: no joint swelling or erythema  Psychiatric: normal affect  Neurologic:CN intact, MS 4/5 bilaterally simetrically; normal finger to nose; gait evaluated with of iv pole as patient felt she would fall; a little bit off balance.   Labs on Admission:  Basic Metabolic Panel:  Recent Labs Lab 11/25/12 0821 11/27/12 1030  NA 137 132*  K 3.8 2.8*  CL 97 94*  CO2 28 26  GLUCOSE 139* 203*  BUN 8 13  CREATININE 0.75 0.86  CALCIUM 9.4 8.7   Liver Function Tests:  Recent Labs Lab 11/25/12 0821 11/27/12 1030  AST 33 43*  ALT 31 42*  ALKPHOS 82 74  BILITOT 0.4 0.4  PROT 7.9 7.1  ALBUMIN 4.0 3.4*   CBC:  Recent Labs Lab 11/25/12 0821 11/27/12 1030  WBC 6.7 6.5  NEUTROABS 5.2 4.5  HGB 15.6* 13.7  HCT 44.2 39.8  MCV 86.2 86.7  PLT 218 176    BNP (last 3 results)  Recent Labs  08/24/12 1446  PROBNP 100.2   CBG:  Recent Labs Lab 11/25/12 1202 11/25/12 1701 11/25/12 2128 11/26/12 0730 11/26/12 1235  GLUCAP 121* 118* 117* 109* 171*    Radiological Exams on Admission: Dg Chest 2 View  11/27/2012   *RADIOLOGY REPORT*  Clinical Data: Cough and shortness of breath.  CHEST - 2 VIEW  Comparison: 11/25/2012.  Findings: The cardiac silhouette, mediastinal and hilar contours are within normal limits and stable.  The lungs demonstrate minimal basilar scarring changes and underlying emphysema.  No acute overlying pulmonary process.  IMPRESSION: Chronic lung changes.  No acute pulmonary findings.   Original Report Authenticated By: Rudie Meyer, M.D.    Assessment/Plan 1-weakness and ataxia: patient with acute weakness,ataxia and difficulties to express herself. -concerns for TIA vs seizure -will admit to telemetry -plavix for secondary prevention as she was on ASA -will check MRI,  EEG -2-D echo and carotid dopplers done on previous admission at [facility] -PT eval/treatment -will check TSH and B12  2-Seizure:will continue keppra and check EEG; neurology consult to be ordered if need. Currently no seizure activity appreciated and patient neurologic exam is completely benign   3-DM type 2 (diabetes mellitus, type 2):will use SSI and modify carb diet  4-Cigarette smoker:cessation counseling provided. Will use nicotine  5-Dehydration:patient has not been eating and drinking properly since Sunday; will give IVF's and follow response.  6-Lactic acidosis:due to dehydration. No signs of infection identified at this moment. Will hydrate and will follow lactic acid.    7-HTN (hypertension): will continue heart healthy diet and lisinopril  8-Hypokalemia:will replete and check Mg level.  DVT: heparin.  Code Status: Full Family  Communication: son and daughter in-law at bedside Disposition Plan: admit to telemetry; inpatient; LOS > 2 midnights  Time spent: >30 minutes  Xavior Niazi [facility] Pager [phone]  If 7PM-7AM, please contact night-coverage [email or web address] Password [facility] 11/27/2012, 7:22 PM

## 2012-11-27 NOTE — ED Provider Notes (Signed)
History     CSN: [id number]  Arrival date & time 11/27/12  1001   First MD Initiated Contact with Patient 11/27/12 1008      Chief Complaint  Patient presents with  . Weakness    (Consider location/radiation/quality/duration/timing/severity/associated sxs/prior treatment) Patient is a 77 y.o. female presenting with weakness. The history is provided by the patient and a relative.  Weakness   patient here complaining of weakness that began this morning and she is now unable to walk. Was recently admitted to the hospital for similar symptoms and discharged yesterday. Review those records show that patient had a negative CT angiogram of the brain. She denies any current fever, chills, vomiting, diarrhea. Some cough but no change in her baseline shortness of breath. Symptoms are worse with exertion better with rest. No treatment used prior to arrival Past Medical History  Diagnosis Date  . Seizures   . Hypertension   . High cholesterol   . Coronary artery disease   . Diabetes mellitus   . Diverticula of colon   . Tubulovillous adenoma 07/13/2003  . H/O Clostridium difficile infection 2005  . Fatty liver     Past Surgical History  Procedure Laterality Date  . Abdominal hysterectomy    . Colonoscopy  07/23/2003    Dr. Jena Gauss- normal rectum, L side diverticula, tubulovillous adenoma  . Colonoscopy  09/26/2006    Dr. Jena Gauss- normal rectum, L side diverticula  . Orif right hip    . Appendectomy    . Bilateral cataract surg    . Colonoscopy  01/31/2012    Procedure: COLONOSCOPY;  Surgeon: Corbin Ade, MD;  Location[facility];  Service: Endoscopy;  Laterality: N/A;  12:00    Family History  Problem Relation Age of Onset  . Heart attack Father   . Cancer Mother     History  Substance Use Topics  . Smoking status: Current Some Day Smoker -- 0.50 packs/day for 50 years    Types: Cigarettes  . Smokeless tobacco: Not on file  . Alcohol Use: No    OB History   Grav Para Term  Preterm Abortions TAB SAB Ect Mult Living   2 2 2              Review of Systems  Neurological: Positive for weakness.  All other systems reviewed and are negative.    Allergies  Penicillins  Home Medications   Current Outpatient Rx  Name  Route  Sig  Dispense  Refill  . aspirin EC 81 MG tablet   Oral   Take 81 mg by mouth daily.         . Carboxymethylcellul-Glycerin (OPTIVE) 0.5-0.9 % SOLN   Ophthalmic   Apply 2 drops to eye daily.         . cetirizine (ZYRTEC) 10 MG tablet   Oral   Take 10 mg by mouth daily.         . folic acid (FOLVITE) 400 MCG tablet   Oral   Take 400 mcg by mouth daily.         Marland Kitchen levETIRAcetam (KEPPRA) 500 MG tablet   Oral   Take 500 mg by mouth 2 (two) times daily.         Marland Kitchen lisinopril (PRINIVIL,ZESTRIL) 10 MG tablet   Oral   Take 10 mg by mouth 2 (two) times daily.         . Multiple Vitamin (MULTIVITAMIN WITH MINERALS) TABS   Oral   Take  1 tablet by mouth daily.         . naproxen sodium (ANAPROX) 220 MG tablet   Oral   Take 220 mg by mouth every 8 (eight) hours as needed. For pain         . Potassium Gluconate 595 MG CAPS   Oral   Take 595 mg by mouth 2 (two) times daily.         . simvastatin (ZOCOR) 40 MG tablet   Oral   Take 40 mg by mouth every evening.           BP 113/44  Pulse 84  Temp(Src) 99.3 F (37.4 C) (Oral)  Resp 26  SpO2 96%  Physical Exam  Nursing note and vitals reviewed. Constitutional: She is oriented to person, place, and time. She appears well-developed and well-nourished.  Non-toxic appearance. No distress.  HENT:  Head: Normocephalic and atraumatic.  Eyes: Conjunctivae, EOM and lids are normal. Pupils are equal, round, and reactive to light.  Neck: Normal range of motion. Neck supple. No tracheal deviation present. No mass present.  Cardiovascular: Normal rate, regular rhythm and normal heart sounds.  Exam reveals no gallop.   No murmur heard. Pulmonary/Chest: Effort  normal. No stridor. No respiratory distress. She has decreased breath sounds. She has no wheezes. She has no rhonchi. She has no rales.  Abdominal: Soft. Normal appearance and bowel sounds are normal. She exhibits no distension. There is no tenderness. There is no rebound and no CVA tenderness.  Musculoskeletal: Normal range of motion. She exhibits no edema and no tenderness.  Neurological: She is alert and oriented to person, place, and time. She has normal strength. No cranial nerve deficit or sensory deficit. GCS eye subscore is 4. GCS verbal subscore is 5. GCS motor subscore is 6.  Skin: Skin is warm and dry. No abrasion and no rash noted.  Psychiatric: She has a normal mood and affect. Her speech is normal and behavior is normal.    ED Course  Procedures (including critical care time)  Labs Reviewed  CULTURE, BLOOD (ROUTINE X 2)  CULTURE, BLOOD (ROUTINE X 2)  URINALYSIS, ROUTINE W REFLEX MICROSCOPIC  CBC WITH DIFFERENTIAL  COMPREHENSIVE METABOLIC PANEL   Ct Angio Head W/cm &/or Wo Cm  11/25/2012   *RADIOLOGY REPORT*  Clinical Data:  77 year old female with syncope, unsteady gait. Diabetes.  CT ANGIOGRAPHY HEAD  Technique:  Multidetector CT imaging of the head was performed using the standard protocol during bolus administration of intravenous contrast.  Multiplanar CT image reconstructions including MIPs were obtained to evaluate the vascular anatomy.  Contrast: 75mL OMNIPAQUE IOHEXOL 350 MG/ML SOLN  Comparison:  Head CTs without contrast 0926 hours the same day and earlier.  Findings:  Stable CT appearance of the brain parenchyma. No abnormal enhancement identified. Stable visualized osseous structures.  Stable paranasal sinuses, mastoids, orbits soft tissues and scalp soft tissues.  Vascular Findings: Major intracranial venous structures appear to remain patent.  Codominant distal vertebral arteries are patent.  Normal PICA origins.  Patent vertebrobasilar junction.  No basilar stenosis. SCA  and PCA origins are within normal limits.  Right PCA branches are within normal limits.  Posterior communicating arteries are diminutive or absent.  There is a moderate focal stenosis of the left PCA distal P1 proximal P2 segment (series 605 image 59).  The left PCA remains patent.  There may be a second area of stenosis of the left PCA bifurcation (series 607 image 101) but the major left PCA  branches remain patent.  The the distal cervical ICA is are within normal limits.  Both ICA siphons are patent.  There is mild to moderate right ICA anterior genu calcified atherosclerosis.  This does not not appear to be hemodynamically significant.  Ophthalmic artery origins and supraclinoid ICA segments are within normal limits.  Carotid termini, MCA and ACA origins are within normal limits.  Diminutive anterior communicating artery.  Bilateral ACA branches are within normal limits.  MCA M1 segments are within normal limits.  The bilateral MCA branches are within normal limits.   Review of the MIP images confirms the above findings.  IMPRESSION: 1.  Moderate multifocal stenosis of the left PCA with preserved distal PCA branch flow.  No CT evidence of acute left PCA infarct identified, and stable CT appearance of brain parenchyma from earlier. 2.  Mild for age anterior circulation atherosclerosis.   Original Report Authenticated By: Erskine Speed, M.D.   US Carotid Duplex Bilateral  11/25/2012   *RADIOLOGY REPORT*  Clinical Data: Syncope  BILATERAL CAROTID DUPLEX ULTRASOUND  Technique: Wallace Cullens scale imaging, color Doppler and duplex ultrasound was performed of bilateral carotid and vertebral arteries in the neck.  Comparison:  CT scan of the head obtained earlier today at 09:26 a.m.  Criteria:  Quantification of carotid stenosis is based on velocity parameters that correlate the residual internal carotid diameter with NASCET-based stenosis levels, using the diameter of the distal internal carotid lumen as the denominator for  stenosis measurement.  The following velocity measurements were obtained:                   PEAK SYSTOLIC/END DIASTOLIC RIGHT ICA:                        165/13cm/sec CCA:                        87/8cm/sec SYSTOLIC ICA/CCA RATIO:     1.9 DIASTOLIC ICA/CCA RATIO:    1.7 ECA:                        154cm/sec  LEFT ICA:                        131/14cm/sec CCA:                        91/10cm/sec SYSTOLIC ICA/CCA RATIO:     1.4 DIASTOLIC ICA/CCA RATIO:    1.4 ECA:                        228cm/sec  Findings:  RIGHT CAROTID ARTERY: Intimal medial thickening noted in the common carotid artery.  Heterogeneous atherosclerotic plaque is noted in the distal common carotid artery, carotid bulb and proximal internal carotid artery.  Calcified atherosclerotic plaque in the proximal internal carotid artery results in 50 - 69% estimated diameter stenosis.  RIGHT VERTEBRAL ARTERY:  Patent with normal antegrade flow.  LEFT CAROTID ARTERY: Intimal medial thickening noted in the distal common carotid artery.  There is mild heterogeneous atherosclerotic plaque in the distal common carotid artery extending into the proximal internal and external carotid arteries.  Focal narrowing and elevated peak systolic velocity in the proximal external carotid artery likely results in greater than 50% diameter stenosis.  Elevated peak systolic velocity in the distal left internal carotid artery may be  exaggerated.  LEFT VERTEBRAL ARTERY:  Patent with normal antegrade flow.  IMPRESSION:  1.  Heterogeneous partially calcified plaque in the proximal right internal carotid artery results in 50 - 69% diameter stenosis.  2.  Mild heterogeneous plaque on the left results in elevated peak systolic velocities in the distal internal carotid artery, however there is no significant plaque in the region of the elevated velocity.  Diameter stenosis is likely less than 50%.  3.  Greater than 50% diameter stenosis of the proximal left external carotid artery  4.   Vertebral arteries are patent with antegrade flow.  Signed,  Sterling Big, MD Vascular & Interventional Radiologist [facility]   Original Report Authenticated By: Malachy Moan, M.D.     No diagnosis found.    MDM   Date: 11/27/2012  Rate: 84  Rhythm: normal sinus rhythm  QRS Axis: normal  Intervals: normal  ST/T Wave abnormalities: nonspecific ST changes  Conduction Disutrbances:right bundle branch block  Narrative Interpretation:   Old EKG Reviewed: none available    1:56 PM Patient given IV fluids here for her weakness and mild lactic acidosis. She'll be admitted to [facility] for treatment of dehydration      Toy Baker, MD 11/27/12 1357

## 2012-11-28 ENCOUNTER — Inpatient Hospital Stay (HOSPITAL_COMMUNITY): Payer: Medicare Other

## 2012-11-28 DIAGNOSIS — Z87820 Personal history of traumatic brain injury: Secondary | ICD-10-CM

## 2012-11-28 LAB — CBC
HCT: 35.3 % — ABNORMAL LOW (ref 36.0–46.0)
Platelets: 164 10*3/uL (ref 150–400)
RBC: 4.05 MIL/uL (ref 3.87–5.11)
RDW: 13.7 % (ref 11.5–15.5)
WBC: 5.4 10*3/uL (ref 4.0–10.5)

## 2012-11-28 LAB — VITAMIN B12: Vitamin B-12: 378 pg/mL (ref 211–911)

## 2012-11-28 LAB — BASIC METABOLIC PANEL
Calcium: 8.2 mg/dL — ABNORMAL LOW (ref 8.4–10.5)
Chloride: 102 mEq/L (ref 96–112)
Creatinine, Ser: 0.59 mg/dL (ref 0.50–1.10)
GFR calc Af Amer: >90 mL/min (ref >90)

## 2012-11-28 LAB — MAGNESIUM: Magnesium: 1.9 mg/dL (ref 1.5–2.5)

## 2012-11-28 LAB — LIPID PANEL
Cholesterol: 104 mg/dL (ref 0–200)
Total CHOL/HDL Ratio: 3.9 RATIO
VLDL: 28 mg/dL (ref 0–40)

## 2012-11-28 LAB — RAPID URINE DRUG SCREEN, HOSP PERFORMED
Barbiturates: NOT DETECTED
Benzodiazepines: NOT DETECTED
Cocaine: NOT DETECTED
Opiates: NOT DETECTED
Tetrahydrocannabinol: NOT DETECTED

## 2012-11-28 LAB — HEMOGLOBIN A1C: Mean Plasma Glucose: 111 mg/dL (ref <117)

## 2012-11-28 LAB — GLUCOSE, CAPILLARY: Glucose-Capillary: 138 mg/dL — ABNORMAL HIGH (ref 70–99)

## 2012-11-28 MED ORDER — LORAZEPAM 2 MG/ML IJ SOLN: 1.0000 mg | Freq: Once | INTRAMUSCULAR | Status: AC

## 2012-11-28 MED ORDER — LORAZEPAM 2 MG/ML IJ SOLN
INTRAMUSCULAR | Status: AC
  Administered 2012-11-28: 1 mg via INTRAVENOUS
  Filled 2012-11-28: qty 1

## 2012-11-28 MED ORDER — POTASSIUM CHLORIDE CRYS ER 20 MEQ PO TBCR
40.0000 meq | EXTENDED_RELEASE_TABLET | Freq: Once | ORAL | Status: AC
  Administered 2012-11-28: 40 meq via ORAL
  Filled 2012-11-28: qty 2

## 2012-11-28 NOTE — Progress Notes (Signed)
Utilization review completed. Jordan Pardini, RN, BSN. 

## 2012-11-28 NOTE — Care Management Note (Signed)
    Page 1 of 2   11/29/2012     11:22:41 AM   CARE MANAGEMENT NOTE 11/29/2012  Patient:  Shannon Maldonado, Shannon Maldonado   Account Number:  [id number]  Date Initiated:  11/28/2012  Documentation initiated by:  Letha Cape  Subjective/Objective Assessment:   dx dehydration, weak  admit- lives with son.     Action/Plan:   pt eval- rec hhpt   Anticipated DC Date:  11/29/2012   Anticipated DC Plan:  HOME W HOME HEALTH SERVICES      DC Planning Services  CM consult      [facility] Choice  HOME HEALTH   Choice offered to / List presented to:  C-1 Patient        HH arranged  HH-1 RN  HH-2 PT      [facility] agency  [facility]   Status of service:  Completed, signed off Medicare Important Message given?   (If response is "NO", the following Medicare IM given date fields will be blank) Date Medicare IM given:   Date Additional Medicare IM given:    Discharge Disposition:  HOME W HOME HEALTH SERVICES  Per UR Regulation:  Reviewed for med. necessity/level of care/duration of stay  If discussed at Long Length of Stay Meetings, dates discussed:    Comments:  11/29/12 11:21 Letha Cape RN, BSN [phone] patient is for possible dc today, pt recs hhpt, patient chose [facility] from agency list for [facility] and HHPT.  Referral made to [facility] , Lupita Leash notified.  Soc will begin 24-48 hrs post discharge.  11/28/12 15:15 Letha Cape RN, BSN [phone] patient lives with her son, will await pt eval.  NCM will continue to follow for dc needs.

## 2012-11-28 NOTE — Procedures (Signed)
EEG report.  Brief clinical history:77 years old female with new onset staring spells. Neuro-exam is unremarkable and neuro-imaging unrevealing. She is on keppra for an unclear history of seizures.  Importantly, she has dementia requiring assistance to perform her activities of daily living Technique: this is a 17 channel routine scalp EEG performed at the bedside with bipolar and monopolar montages arranged in accordance to the international 10/20 system of electrode placement. One channel was dedicated to EKG recording.  The study was performed during wakefulness, drowsiness, and stage 2 sleep. No activating procedures performed during the test.  Description:In the wakeful state, the best background consisted of a medium amplitude, posterior dominant, well sustained, symmetric and reactive 11 Hz rhythm. Drowsiness demonstrated dropout of the alpha rhythm. Stage 2 sleep showed symmetric and synchronous sleep spindles without intermixed epileptiform discharges. No focal or generalized epileptiform discharges noted.  Frequent, intermittent, at times sharply contoured left temporo-parietal theta slowing was noted. EKG showed sinus rhythm.  Impression: this is an abnormal awake and asleep EEG with findings consistent with focal neuronal dysfunction involving the left temporo-parietal region. Clinical correlation is advised.  Wyatt Portela, MD

## 2012-11-28 NOTE — Progress Notes (Signed)
PATIENT DETAILS Name: Shannon Maldonado Age: 77 y.o. Sex: female Date of Birth: [birth year] Admit Date: 11/27/2012 Admitting Physician No admitting provider for patient encounter. QIO:[email or web address] M, MD  Subjective: Admitted with "weakness spells". Just discharged from [facility] on 6/3-was walking and back to her baseline at the time of discharge-however when she went home- she had gen weakness and staring spells. No seizure movements per family  Assessment/Plan: Active Problems: Transient gen weakness/Ataxia/Staring spells. -seems to be back to his baseline -not sure what the exact etiology is-given her hx of seizures-breakthrough seizures are possible. Doubt TIA -MRI brain negative for CVA, Echo on 6/2 EF 65-70%,Carotid Dopplers 0n 6/2-50-69 % stenosis in Right ICA-do not think she symptoms are explained from this amount of stenosis -await EEG-she may need a prolonged EEG-will ask Neuro to evaluate -needs PT eval -c/w Plavix and Keppra  Hypokalemia ?etioliogy, K better following repletion  Dehydration -resolved with IVF  Hx of Seizures -c/w Keppra -see above discussion  HTN -controlled with Lisinopril  Dyslipidemia -c/w Zocor  Diabetes mellitus type 2, diet controlled:  -Stable. Sliding-scale insulin.   Tobacco Abuse -counseled extensively -transdermal nicotine  Remote history of closed head injury:  She has ongoing followup Neurology at [facility].  Disposition: Remain inpatient  DVT Prophylaxis: Prophylactic Lovenox   Code Status: Full code   Family Communication Sons at bedside  Procedures:  None  CONSULTS:  neurology   MEDICATIONS: Scheduled Meds: . clopidogrel  75 mg Oral Q breakfast  . enoxaparin (LOVENOX) injection  40 mg Subcutaneous Q24H  . insulin aspart  0-9 Units Subcutaneous TID WC  . levETIRAcetam  500 mg Oral BID  . lisinopril  20 mg Oral Daily  . loratadine  10 mg Oral Daily  . multivitamin with minerals  1 tablet Oral Daily  .  nicotine  14 mg Transdermal Daily  . simvastatin  40 mg Oral QPM   Continuous Infusions:  PRN Meds:.polyvinyl alcohol  Antibiotics: Anti-infectives   None       PHYSICAL EXAM: Vital signs in last 24 hours: Filed Vitals:   11/27/12 2000 11/27/12 2218 11/28/12 0510 11/28/12 0515  BP:  177/80 165/82   Pulse:  78 80   Temp: 98.5 F (36.9 C) 98.9 F (37.2 C) 99.2 F (37.3 C)   TempSrc:  Oral Oral   Resp:  18 22   Height:      Weight:      SpO2:  94% 87% 96%    Weight change:  Filed Weights   11/27/12 1743  Weight: 76.21 kg (168 lb 0.2 oz)   Body mass index is 25.55 kg/(m^2).   Gen Exam: Awake and alert with clear speech.   Neck: Supple, No JVD.   Chest: B/L Clear.   CVS: S1 S2 Regular, no murmurs.  Abdomen: soft, BS +, non tender, non distended.  Extremities: no edema, lower extremities warm to touch. Neurologic: Non Focal.   Skin: No Rash.   Wounds: N/A.   Intake/Output from previous day: No intake or output data in the 24 hours ending 11/28/12 1051   LAB RESULTS: CBC  Recent Labs Lab 11/25/12 0821 11/27/12 1030 11/28/12 0500  WBC 6.7 6.5 5.4  HGB 15.6* 13.7 11.8*  HCT 44.2 39.8 35.3*  PLT 218 176 164  MCV 86.2 86.7 87.2  MCH 30.4 29.8 29.1  MCHC 35.3 34.4 33.4  RDW 13.4 13.4 13.7  LYMPHSABS 0.8 1.4  --   MONOABS 0.6 0.4  --   EOSABS 0.0 0.0  --  BASOSABS 0.0 0.2*  --     Chemistries   Recent Labs Lab 11/25/12 0821 11/27/12 1030 11/28/12 0500  NA 137 132* 136  K 3.8 2.8* 3.4*  CL 97 94* 102  CO2 28 26 24   GLUCOSE 139* 203* 132*  BUN 8 13 7   CREATININE 0.75 0.86 0.59  CALCIUM 9.4 8.7 8.2*  MG  --   --  1.9    CBG:  Recent Labs Lab 11/25/12 2128 11/26/12 0730 11/26/12 1235 11/27/12 2210 11/28/12 0825  GLUCAP 117* 109* 171* 182* 138*    GFR Estimated Creatinine Clearance: 61.3 ml/min (by C-G formula based on Cr of 0.59).  Coagulation profile No results found for this basename: INR, PROTIME,  in the last 168  hours  Cardiac Enzymes No results found for this basename: CK, CKMB, TROPONINI, MYOGLOBIN,  in the last 168 hours  No components found with this basename: POCBNP,  No results found for this basename: DDIMER,  in the last 72 hours No results found for this basename: HGBA1C,  in the last 72 hours  Recent Labs  11/26/12 0546 11/28/12 0500  CHOL 114 104  HDL 38* 27*  LDLCALC 48 49  TRIG 141 139  CHOLHDL 3.0 3.9   No results found for this basename: TSH, T4TOTAL, FREET3, T3FREE, THYROIDAB,  in the last 72 hours No results found for this basename: VITAMINB12, FOLATE, FERRITIN, TIBC, IRON, RETICCTPCT,  in the last 72 hours No results found for this basename: LIPASE, AMYLASE,  in the last 72 hours  Urine Studies No results found for this basename: UACOL, UAPR, USPG, UPH, UTP, UGL, UKET, UBIL, UHGB, UNIT, UROB, ULEU, UEPI, UWBC, URBC, UBAC, CAST, CRYS, UCOM, BILUA,  in the last 72 hours  MICROBIOLOGY: Recent Results (from the past 240 hour(s))  CULTURE, BLOOD (ROUTINE X 2)     Status: None   Collection Time    11/27/12 10:30 AM      Result Value Range Status   Specimen Description BLOOD RIGHT ARM   Final   Special Requests BOTTLES DRAWN AEROBIC AND ANAEROBIC 10CC   Final   Culture  Setup Time 11/27/2012 15:07   Final   Culture     Final   Value:        BLOOD CULTURE RECEIVED NO GROWTH TO DATE CULTURE WILL BE HELD FOR 5 DAYS BEFORE ISSUING A FINAL NEGATIVE REPORT   Report Status PENDING   Incomplete  CULTURE, BLOOD (ROUTINE X 2)     Status: None   Collection Time    11/27/12 10:40 AM      Result Value Range Status   Specimen Description BLOOD LEFT ARM   Final   Special Requests BOTTLES DRAWN AEROBIC AND ANAEROBIC 10CC   Final   Culture  Setup Time 11/27/2012 15:10   Final   Culture     Final   Value:        BLOOD CULTURE RECEIVED NO GROWTH TO DATE CULTURE WILL BE HELD FOR 5 DAYS BEFORE ISSUING A FINAL NEGATIVE REPORT   Report Status PENDING   Incomplete    RADIOLOGY  STUDIES/RESULTS: Ct Angio Head W/cm &/or Wo Cm  11/25/2012   *RADIOLOGY REPORT*  Clinical Data:  77 year old female with syncope, unsteady gait. Diabetes.  CT ANGIOGRAPHY HEAD  Technique:  Multidetector CT imaging of the head was performed using the standard protocol during bolus administration of intravenous contrast.  Multiplanar CT image reconstructions including MIPs were obtained to evaluate the vascular anatomy.  Contrast:  75mL OMNIPAQUE IOHEXOL 350 MG/ML SOLN  Comparison:  Head CTs without contrast 0926 hours the same day and earlier.  Findings:  Stable CT appearance of the brain parenchyma. No abnormal enhancement identified. Stable visualized osseous structures.  Stable paranasal sinuses, mastoids, orbits soft tissues and scalp soft tissues.  Vascular Findings: Major intracranial venous structures appear to remain patent.  Codominant distal vertebral arteries are patent.  Normal PICA origins.  Patent vertebrobasilar junction.  No basilar stenosis. SCA and PCA origins are within normal limits.  Right PCA branches are within normal limits.  Posterior communicating arteries are diminutive or absent.  There is a moderate focal stenosis of the left PCA distal P1 proximal P2 segment (series 605 image 59).  The left PCA remains patent.  There may be a second area of stenosis of the left PCA bifurcation (series 607 image 101) but the major left PCA branches remain patent.  The the distal cervical ICA is are within normal limits.  Both ICA siphons are patent.  There is mild to moderate right ICA anterior genu calcified atherosclerosis.  This does not not appear to be hemodynamically significant.  Ophthalmic artery origins and supraclinoid ICA segments are within normal limits.  Carotid termini, MCA and ACA origins are within normal limits.  Diminutive anterior communicating artery.  Bilateral ACA branches are within normal limits.  MCA M1 segments are within normal limits.  The bilateral MCA branches are within  normal limits.   Review of the MIP images confirms the above findings.  IMPRESSION: 1.  Moderate multifocal stenosis of the left PCA with preserved distal PCA branch flow.  No CT evidence of acute left PCA infarct identified, and stable CT appearance of brain parenchyma from earlier. 2.  Mild for age anterior circulation atherosclerosis.   Original Report Authenticated By: Erskine Speed, M.D.   Dg Chest 1 View  11/25/2012   *RADIOLOGY REPORT*  Clinical Data: Cough, fever  CHEST - 1 VIEW  Comparison: August 24, 2012.  Findings: Cardiomediastinal silhouette appears normal.  No acute pulmonary disease is noted.  Bony thorax is intact.  Hyperinflation of the lungs is again noted and unchanged compared to prior exam.  IMPRESSION: No acute cardiopulmonary abnormality seen.   Original Report Authenticated By: Lupita Raider.,  M.D.   Dg Chest 2 View  11/27/2012   *RADIOLOGY REPORT*  Clinical Data: Cough and shortness of breath.  CHEST - 2 VIEW  Comparison: 11/25/2012.  Findings: The cardiac silhouette, mediastinal and hilar contours are within normal limits and stable.  The lungs demonstrate minimal basilar scarring changes and underlying emphysema.  No acute overlying pulmonary process.  IMPRESSION: Chronic lung changes.  No acute pulmonary findings.   Original Report Authenticated By: Rudie Meyer, M.D.   Ct Head Wo Contrast  11/25/2012   *RADIOLOGY REPORT*  Clinical Data: Unsteady gait.  Nausea.  Diabetic.  Hypertension. History seizures.  CT HEAD WITHOUT CONTRAST  Technique:  Contiguous axial images were obtained from the base of the skull through the vertex without contrast.  Comparison: 06/04/2009.  Findings: No intracranial hemorrhage.  Right middle cerebral artery branch vessel appears slightly dense new from prior examination.  Small thrombus not excluded.  Streak artifact extends through the posterior fossa structures.  No obvious large posterior fossa infarct.  Prominent small vessel disease type changes.   Global atrophy without hydrocephalus.  No intracranial mass lesion detected on this unenhanced exam.  IMPRESSION: No intracranial hemorrhage.  Right middle cerebral artery branch vessel appears slightly dense  new from prior examination.  Small thrombus not excluded.  Streak artifact extends through the posterior fossa structures.  No obvious large posterior fossa infarct.  Prominent small vessel disease type changes.   Original Report Authenticated By: Lacy Duverney, M.D.   Mri Brain Without Contrast  11/28/2012   *RADIOLOGY REPORT*  Clinical Data:  Diabetic hypertensive patient with hypercholesterolemia presenting with sided weakness involving legs and ataxia and difficulty finding words.  MRI BRAIN WITHOUT CONTRAST MRA HEAD WITHOUT CONTRAST  Technique: Multiplanar, multiecho pulse sequences of the brain and surrounding structures were obtained according to standard protocol without intravenous contrast.  Angiographic images of the head were obtained using MRA technique without contrast.  Comparison: 11/25/2012 head CT.  No comparison brain MR.  MRI HEAD  Findings:  No acute infarct.  No intracranial hemorrhage.  Moderate small vessel disease type changes.  Global atrophy without hydrocephalus.  No intracranial mass lesion detected on this unenhanced exam.  Major intracranial vascular structures are patent.  Cervical medullary junction, pituitary region, pineal region and orbital structures unremarkable.  Minimal paranasal sinus mucosal thickening.  IMPRESSION: No acute infarct.  Moderate small vessel disease type changes.  Please see above.  MRA HEAD  Findings: Anterior circulation without medium or large size vessel occlusion or significant stenosis.  Middle cerebral artery branch vessel irregularity bilaterally.  No significant stenosis of the distal vertebral arteries or basilar artery.  Tiny AICA bilaterally.  Mild to moderate focal stenosis proximal left superior cerebral artery.  Moderate narrowing proximal  left posterior cerebral artery with moderate to marked narrowing mid to distal left posterior cerebral artery branch vessels.  Mild narrowing proximal and distal aspect of the right posterior cerebral artery.  No aneurysm or vascular malformation.  IMPRESSION: Intracranial atherosclerotic type changes most notable involving the left posterior cerebral artery as detailed above.   Original Report Authenticated By: Lacy Duverney, M.D.   US Carotid Duplex Bilateral  11/25/2012   *RADIOLOGY REPORT*  Clinical Data: Syncope  BILATERAL CAROTID DUPLEX ULTRASOUND  Technique: Wallace Cullens scale imaging, color Doppler and duplex ultrasound was performed of bilateral carotid and vertebral arteries in the neck.  Comparison:  CT scan of the head obtained earlier today at 09:26 a.m.  Criteria:  Quantification of carotid stenosis is based on velocity parameters that correlate the residual internal carotid diameter with NASCET-based stenosis levels, using the diameter of the distal internal carotid lumen as the denominator for stenosis measurement.  The following velocity measurements were obtained:                   PEAK SYSTOLIC/END DIASTOLIC RIGHT ICA:                        165/13cm/sec CCA:                        87/8cm/sec SYSTOLIC ICA/CCA RATIO:     1.9 DIASTOLIC ICA/CCA RATIO:    1.7 ECA:                        154cm/sec  LEFT ICA:                        131/14cm/sec CCA:                        91/10cm/sec SYSTOLIC ICA/CCA RATIO:     1.4 DIASTOLIC ICA/CCA RATIO:  1.4 ECA:                        228cm/sec  Findings:  RIGHT CAROTID ARTERY: Intimal medial thickening noted in the common carotid artery.  Heterogeneous atherosclerotic plaque is noted in the distal common carotid artery, carotid bulb and proximal internal carotid artery.  Calcified atherosclerotic plaque in the proximal internal carotid artery results in 50 - 69% estimated diameter stenosis.  RIGHT VERTEBRAL ARTERY:  Patent with normal antegrade flow.  LEFT CAROTID  ARTERY: Intimal medial thickening noted in the distal common carotid artery.  There is mild heterogeneous atherosclerotic plaque in the distal common carotid artery extending into the proximal internal and external carotid arteries.  Focal narrowing and elevated peak systolic velocity in the proximal external carotid artery likely results in greater than 50% diameter stenosis.  Elevated peak systolic velocity in the distal left internal carotid artery may be exaggerated.  LEFT VERTEBRAL ARTERY:  Patent with normal antegrade flow.  IMPRESSION:  1.  Heterogeneous partially calcified plaque in the proximal right internal carotid artery results in 50 - 69% diameter stenosis.  2.  Mild heterogeneous plaque on the left results in elevated peak systolic velocities in the distal internal carotid artery, however there is no significant plaque in the region of the elevated velocity.  Diameter stenosis is likely less than 50%.  3.  Greater than 50% diameter stenosis of the proximal left external carotid artery  4.  Vertebral arteries are patent with antegrade flow.  Signed,  Sterling Big, MD Vascular & Interventional Radiologist [facility]   Original Report Authenticated By: Malachy Moan, M.D.   Mr Mra Head/brain Wo Cm  11/28/2012   *RADIOLOGY REPORT*  Clinical Data:  Diabetic hypertensive patient with hypercholesterolemia presenting with sided weakness involving legs and ataxia and difficulty finding words.  MRI BRAIN WITHOUT CONTRAST MRA HEAD WITHOUT CONTRAST  Technique: Multiplanar, multiecho pulse sequences of the brain and surrounding structures were obtained according to standard protocol without intravenous contrast.  Angiographic images of the head were obtained using MRA technique without contrast.  Comparison: 11/25/2012 head CT.  No comparison brain MR.  MRI HEAD  Findings:  No acute infarct.  No intracranial hemorrhage.  Moderate small vessel disease type changes.  Global atrophy without  hydrocephalus.  No intracranial mass lesion detected on this unenhanced exam.  Major intracranial vascular structures are patent.  Cervical medullary junction, pituitary region, pineal region and orbital structures unremarkable.  Minimal paranasal sinus mucosal thickening.  IMPRESSION: No acute infarct.  Moderate small vessel disease type changes.  Please see above.  MRA HEAD  Findings: Anterior circulation without medium or large size vessel occlusion or significant stenosis.  Middle cerebral artery branch vessel irregularity bilaterally.  No significant stenosis of the distal vertebral arteries or basilar artery.  Tiny AICA bilaterally.  Mild to moderate focal stenosis proximal left superior cerebral artery.  Moderate narrowing proximal left posterior cerebral artery with moderate to marked narrowing mid to distal left posterior cerebral artery branch vessels.  Mild narrowing proximal and distal aspect of the right posterior cerebral artery.  No aneurysm or vascular malformation.  IMPRESSION: Intracranial atherosclerotic type changes most notable involving the left posterior cerebral artery as detailed above.   Original Report Authenticated By: Lacy Duverney, M.D.    Jeoffrey Massed, MD  [facility] Pager:[phone]  If 7PM-7AM, please contact night-coverage [email or web address] Password [email or web address] 11/28/2012, 10:51 AM   LOS: 1 day

## 2012-11-28 NOTE — Evaluation (Signed)
Physical Therapy Evaluation Patient Details Name: Shannon Maldonado MRN: [id number] DOB: May 02, 1936 Today's Date: 11/28/2012 Time: 6578-4696 PT Time Calculation (min): 34 min  PT Assessment / Plan / Recommendation Clinical Impression  77 yo female admitted with dehydration and possible seizure activity.  Pt presents with deconditioning and gait balance impairment requiring use of RW for stability. Recommend continued PT with HHPT    PT Assessment  Patient needs continued PT services    Follow Up Recommendations  Home health PT    Does the patient have the potential to tolerate intense rehabilitation      Barriers to Discharge        Equipment Recommendations  None recommended by PT    Recommendations for Other Services OT consult   Frequency Min 3X/week    Precautions / Restrictions Precautions Precautions: Fall Restrictions Weight Bearing Restrictions: No   Pertinent Vitals/Pain No c/o pain      Mobility  Bed Mobility Bed Mobility: Supine to Sit;Sit to Supine Supine to Sit: 4: Min guard Sit to Supine: 4: Min guard Transfers Transfers: Sit to Stand;Stand to Sit Sit to Stand: 5: Supervision Stand to Sit: 5: Supervision Details for Transfer Assistance: Pt to bathroom for urination Ambulation/Gait Ambulation/Gait Assistance: 4: Min assist Ambulation Distance (Feet): 100 Feet (2 trials) Assistive device: Rolling walker Ambulation/Gait Assistance Details: assist needed for safety and to direct RW.  pt needs more assist as she fatigues Gait Pattern: Step-through pattern;Trunk flexed Gait velocity: decr General Gait Details: pt needs assist of walker for stability , but she pushes it too far out in front of her and does not step into it even with cuing  Pt fatigues easiily  Stairs: No Wheelchair Mobility Wheelchair Mobility: No    Exercises General Exercises - Lower Extremity Long Arc Quad: AROM;Both;5 reps;Seated Hip Flexion/Marching: AROM;Both;5  reps;Seated Other Exercises Other Exercises: bilateral hip to hip in sitting. Pt with fatigue with 5 reps Other Exercises: repeated sit to stand.  Pt with fatigue with 3 reps   PT Diagnosis: Difficulty walking;Abnormality of gait;Generalized weakness  PT Problem List: Decreased activity tolerance;Decreased mobility;Decreased balance;Decreased safety awareness PT Treatment Interventions: Gait training;Functional mobility training;Stair training;Therapeutic activities;Therapeutic exercise;Balance training;Patient/family education   PT Goals Acute Rehab PT Goals PT Goal Formulation: With patient/family Time For Goal Achievement: 12/12/12 Potential to Achieve Goals: Good Pt will Ambulate: >150 feet;with modified independence;with least restrictive assistive device PT Goal: Ambulate - Progress: Goal set today Pt will Go Up / Down Stairs: 3-5 stairs;with min assist PT Goal: Up/Down Stairs - Progress: Goal set today  Visit Information  Last PT Received On: 11/28/12    Subjective Data  Subjective: "This came on all of a sudden" Patient Stated Goal: to find out what happened   Prior Functioning  Home Living Lives With: Family Available Help at Discharge: Family;Available 24 hours/day Type of Home: House Home Access: Stairs to enter [employer] of Steps: 3 Home Layout: One level Home Adaptive Equipment: Walker - rolling Additional Comments: famiily has someone staying with patient because of memory problems Prior Function Level of Independence: Independent Able to Take Stairs?: Yes Driving: Yes Communication Communication: No difficulties    Cognition  Cognition Arousal/Alertness: Awake/alert Behavior During Therapy: WFL for tasks assessed/performed Overall Cognitive Status: Within Functional Limits for tasks assessed    Extremity/Trunk Assessment Right Lower Extremity Assessment RLE ROM/Strength/Tone: [facility] for tasks assessed (recent development of eczema on both  heels) Left Lower Extremity Assessment LLE ROM/Strength/Tone: [facility] for tasks assessed Trunk Assessment Trunk Assessment:  Kyphotic   Balance Balance Balance Assessed: Yes Static Sitting Balance Static Sitting - Balance Support: No upper extremity supported Static Sitting - Level of Assistance: 7: Independent Static Standing Balance Static Standing - Balance Support: No upper extremity supported Static Standing - Level of Assistance: 4: Min assist  End of Session PT - End of Session Equipment Utilized During Treatment: Gait belt Activity Tolerance: Patient limited by fatigue Patient left: in bed;with family/visitor present  GP    Rosey Bath K. [place], [place] [phone] 11/28/2012, 5:19 PM

## 2012-11-28 NOTE — Progress Notes (Signed)
EEG Completed; Results Pending  
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
## 2012-11-29 LAB — BASIC METABOLIC PANEL
BUN: 7 mg/dL (ref 6–23)
Creatinine, Ser: 0.63 mg/dL (ref 0.50–1.10)
GFR calc Af Amer: >90 mL/min (ref >90)
GFR calc non Af Amer: 86 mL/min — ABNORMAL LOW (ref >90)

## 2012-11-29 MED ORDER — CLOPIDOGREL BISULFATE 75 MG PO TABS: 75.0000 mg | ORAL_TABLET | Freq: Every day | ORAL | Status: AC
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
## 2012-11-29 NOTE — Progress Notes (Signed)
11/29/12 Patient discharged home with son. IV site removed. Discharge instructions reviewed with family.

## 2012-11-29 NOTE — Progress Notes (Signed)
Physical Therapy Treatment Patient Details Name: Shannon Maldonado MRN: [id number] DOB: 26-Mar-1936 Today's Date: 11/29/2012 Time: 4098-1191 PT Time Calculation (min): 23 min  PT Assessment / Plan / Recommendation Comments on Treatment Session  pt doing very well    Follow Up Recommendations        Does the patient have the potential to tolerate intense rehabilitation     Barriers to Discharge        Equipment Recommendations  None recommended by PT    Recommendations for Other Services    Frequency     Plan Discharge plan remains appropriate    Precautions / Restrictions Precautions Precautions: Fall   Pertinent Vitals/Pain Denies pain    Mobility  Bed Mobility Bed Mobility: Not assessed Transfers Transfers: Sit to Stand;Stand to Sit Sit to Stand: 7: Independent Stand to Sit: 7: Independent Ambulation/Gait Ambulation/Gait Assistance: 5: Supervision Ambulation Distance (Feet): 350 Feet Assistive device: None Ambulation/Gait Assistance Details: cues for trunk extension Gait Pattern: Step-through pattern;Trunk flexed    Exercises Other Exercises Other Exercises: bil trunk rotation, with mild overpressure; pt tol well Other Exercises: scapular retraction (rowing motion)using bedside table x 8 reps   PT Diagnosis:    PT Problem List:   PT Treatment Interventions:     PT Goals Acute Rehab PT Goals Time For Goal Achievement: 12/12/12 Potential to Achieve Goals: Good Pt will Ambulate: >150 feet;with modified independence;with least restrictive assistive device PT Goal: Ambulate - Progress: Progressing toward goal  Visit Information  Last PT Received On: 11/29/12 Assistance Needed: +1    Subjective Data  Subjective: I have been up   Cognition  Cognition Arousal/Alertness: Awake/alert Behavior During Therapy: WFL for tasks assessed/performed Overall Cognitive Status: Within Functional Limits for tasks assessed    Balance  Balance Balance Assessed:  Yes Static Standing Balance Static Standing - Balance Support: No upper extremity supported Static Standing - Level of Assistance: 5: Stand by assistance Dynamic Standing Balance Dynamic Standing - Balance Support: No upper extremity supported Dynamic Standing - Level of Assistance: 5: Stand by assistance Dynamic Standing - Balance Activities: Lateral lean/weight shifting;Forward lean/weight shifting;Reaching across midline High Level Balance High Level Balance Activites: Direction changes;Turns;Sudden stops;Head turns High Level Balance Comments: no LOB; also mild perturbations with no LOB while amb  End of Session PT - End of Session Equipment Utilized During Treatment: Gait belt Activity Tolerance: Patient tolerated treatment well Patient left: in chair;with call bell/phone within reach;with family/visitor present   GP     [facility] 11/29/2012, 1:41 PM
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
## 2012-12-02 LAB — GLUCOSE, CAPILLARY: Glucose-Capillary: 89 mg/dL (ref 70–99)

## 2012-12-03 LAB — CULTURE, BLOOD (ROUTINE X 2): Culture: NO GROWTH

## 2013-07-31 ENCOUNTER — Telehealth (HOSPITAL_COMMUNITY): Payer: Self-pay | Admitting: *Deleted

## 2013-09-19 ENCOUNTER — Other Ambulatory Visit (HOSPITAL_COMMUNITY): Payer: Self-pay | Admitting: Family Medicine

## 2013-09-19 DIAGNOSIS — Z1231 Encounter for screening mammogram for malignant neoplasm of breast: Secondary | ICD-10-CM

## 2013-09-25 ENCOUNTER — Ambulatory Visit (HOSPITAL_COMMUNITY)
Admission: RE | Admit: 2013-09-25 | Discharge: 2013-09-25 | Disposition: A | Discharge status: Home / Self Care | Payer: Medicare HMO | Source: Ambulatory Visit | Attending: Family Medicine | Admitting: Family Medicine

## 2013-09-25 DIAGNOSIS — Z1231 Encounter for screening mammogram for malignant neoplasm of breast: Secondary | ICD-10-CM | POA: Insufficient documentation

## 2014-03-10 ENCOUNTER — Other Ambulatory Visit (HOSPITAL_COMMUNITY): Payer: Self-pay | Admitting: Family Medicine

## 2014-03-10 DIAGNOSIS — N644 Mastodynia: Secondary | ICD-10-CM

## 2014-03-17 ENCOUNTER — Other Ambulatory Visit (HOSPITAL_COMMUNITY): Payer: Self-pay | Admitting: Family Medicine

## 2014-03-17 ENCOUNTER — Ambulatory Visit (HOSPITAL_COMMUNITY)
Admission: RE | Admit: 2014-03-17 | Discharge: 2014-03-17 | Disposition: A | Discharge status: Home / Self Care | Payer: Medicare HMO | Source: Ambulatory Visit | Attending: Family Medicine | Admitting: Family Medicine

## 2014-03-17 DIAGNOSIS — N644 Mastodynia: Secondary | ICD-10-CM

## 2014-04-27 ENCOUNTER — Encounter (HOSPITAL_COMMUNITY): Payer: Self-pay | Admitting: Emergency Medicine

## 2014-06-04 ENCOUNTER — Other Ambulatory Visit (HOSPITAL_COMMUNITY): Payer: Self-pay | Admitting: Family Medicine

## 2014-06-04 DIAGNOSIS — M8580 Other specified disorders of bone density and structure, unspecified site: Secondary | ICD-10-CM

## 2014-06-11 ENCOUNTER — Ambulatory Visit (HOSPITAL_COMMUNITY)
Admission: RE | Admit: 2014-06-11 | Discharge: 2014-06-11 | Disposition: A | Discharge status: Home / Self Care | Payer: Medicare HMO | Source: Ambulatory Visit | Attending: Family Medicine | Admitting: Family Medicine

## 2014-06-11 DIAGNOSIS — M818 Other osteoporosis without current pathological fracture: Secondary | ICD-10-CM | POA: Insufficient documentation

## 2014-06-11 DIAGNOSIS — R29890 Loss of height: Secondary | ICD-10-CM | POA: Diagnosis not present

## 2014-06-11 DIAGNOSIS — Z90711 Acquired absence of uterus with remaining cervical stump: Secondary | ICD-10-CM | POA: Diagnosis not present

## 2014-06-11 DIAGNOSIS — M8580 Other specified disorders of bone density and structure, unspecified site: Secondary | ICD-10-CM | POA: Insufficient documentation

## 2014-06-11 DIAGNOSIS — Z780 Asymptomatic menopausal state: Secondary | ICD-10-CM | POA: Diagnosis not present

## 2014-06-11 DIAGNOSIS — Z13820 Encounter for screening for osteoporosis: Secondary | ICD-10-CM | POA: Diagnosis not present

## 2014-06-11 DIAGNOSIS — F17210 Nicotine dependence, cigarettes, uncomplicated: Secondary | ICD-10-CM | POA: Insufficient documentation

## 2014-07-19 IMAGING — CT CT HEAD W/O CM
1 series · 16 of 30 positions shown, 20 images · non-contrast
Comparison: 06/04/2009.

CLINICAL DATA: Unsteady gait.  Nausea.  Diabetic.  Hypertension.
History seizures.

CT HEAD WITHOUT CONTRAST
TECHNIQUE: Contiguous axial images were obtained from the base of
the skull through the vertex without contrast.

[Series 2: headseq 4.8 h37s · axial · 0.43mm/px · z∈[+196,+356]mm · 16 of 36 slices shown, 20 images]
[im 2/36  brain]
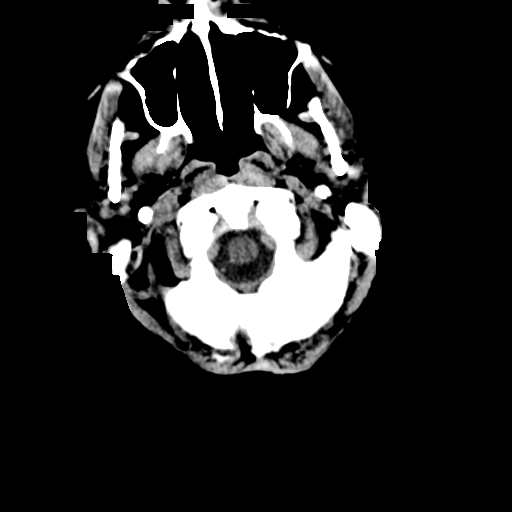
[im 2/36  bone]
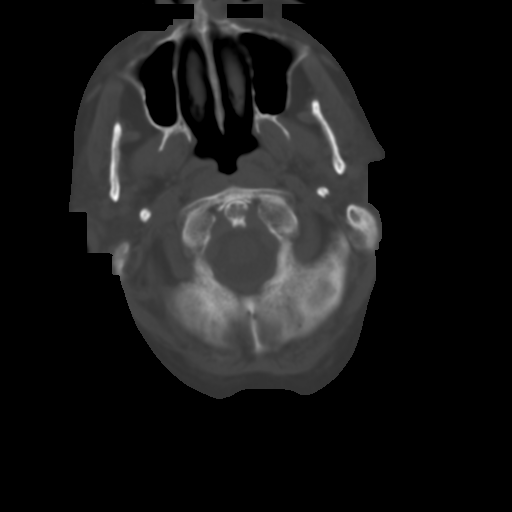
[im 4/36  brain]
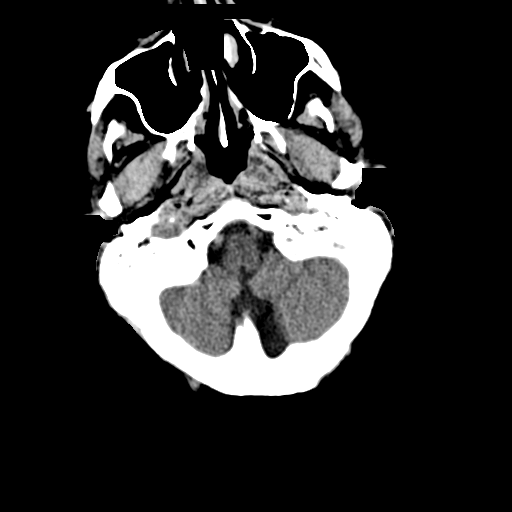
[im 7/36  brain]
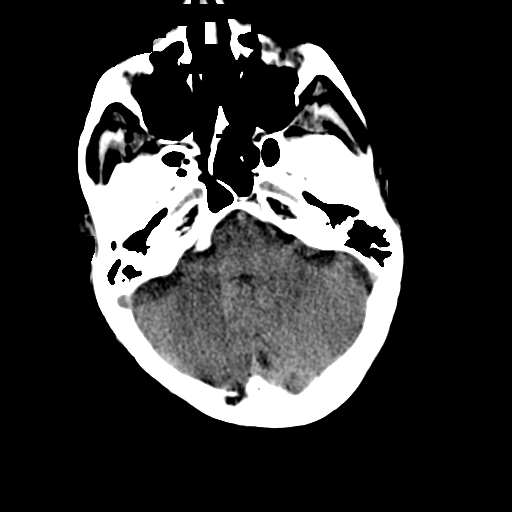
[im 9/36  brain]
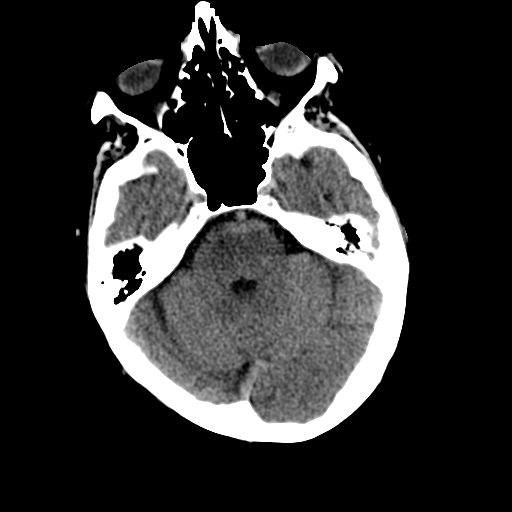
[im 10/36  brain]
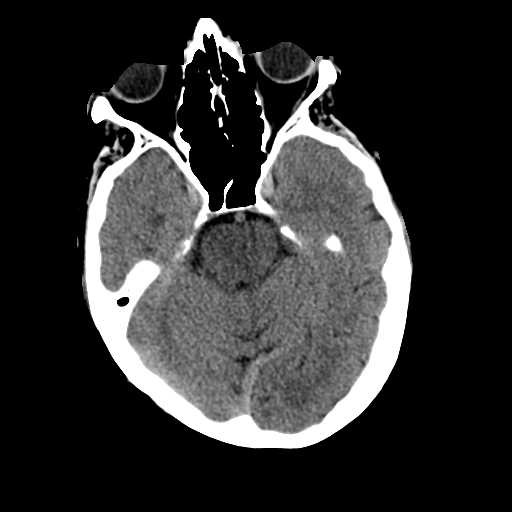
[im 10/36  bone]
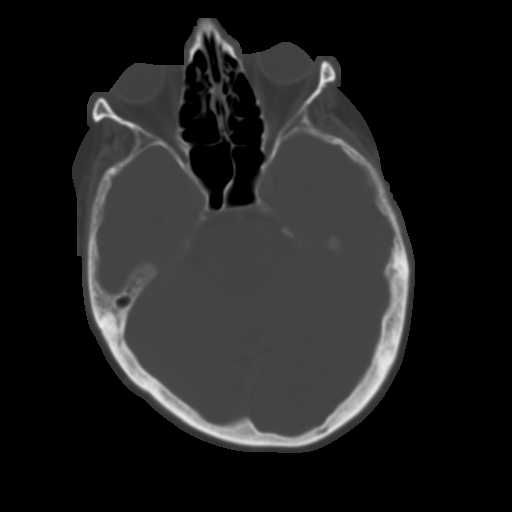
[im 13/36  brain]
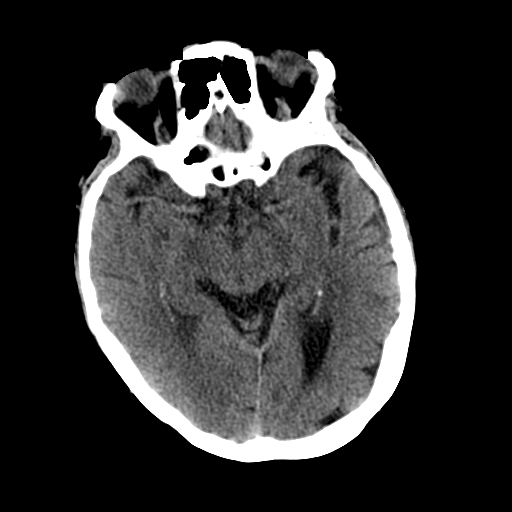
[im 15/36  brain]
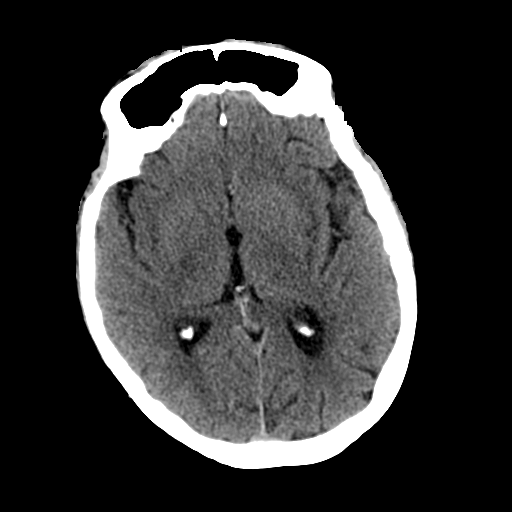
[im 17/36  brain]
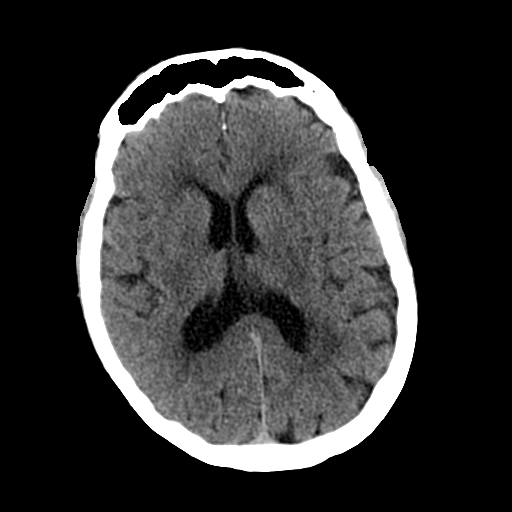
[im 19/36  brain]
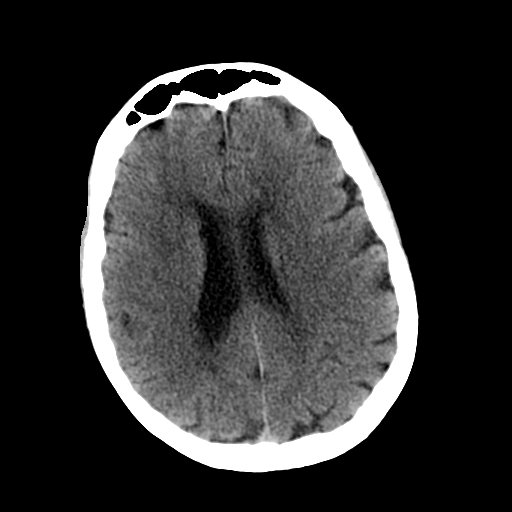
[im 19/36  bone]
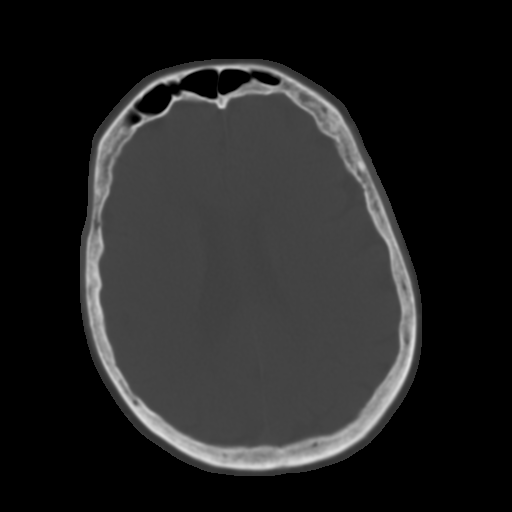
[im 21/36  brain]
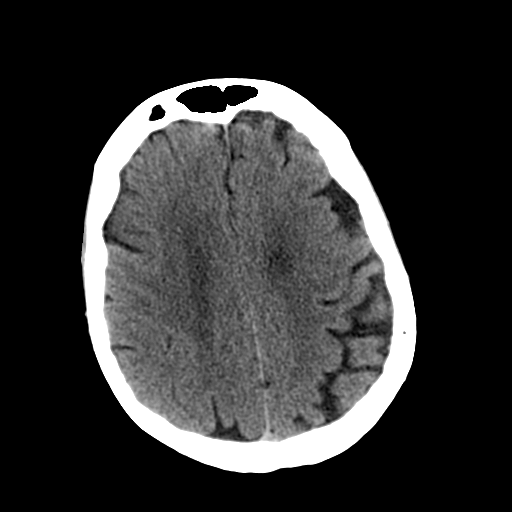
[im 23/36  brain]
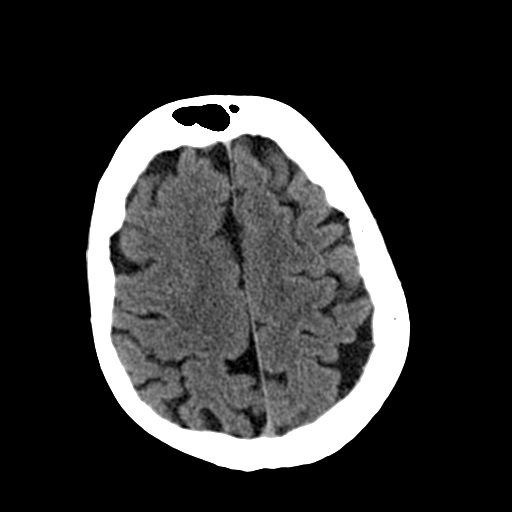
[im 26/36  brain]
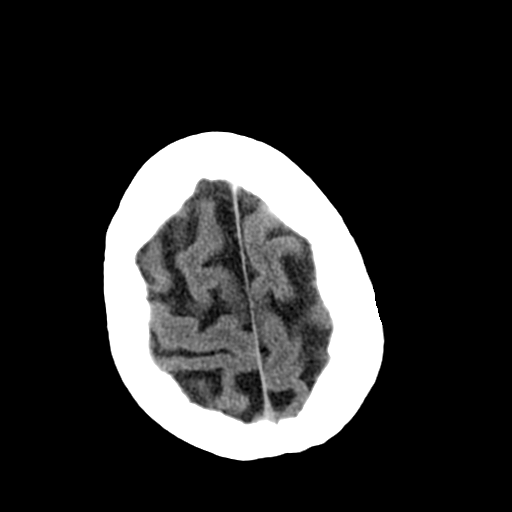
[im 27/36  brain]
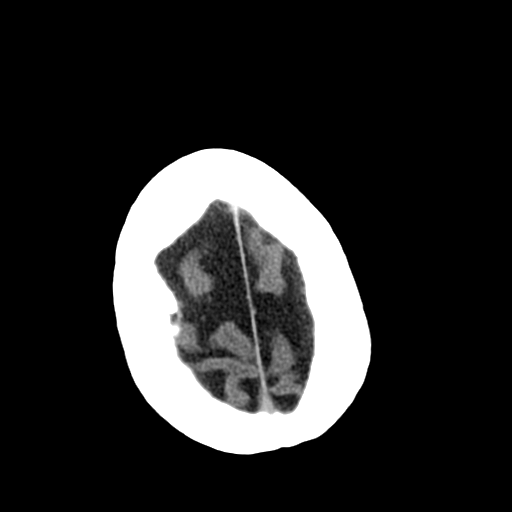
[im 27/36  bone]
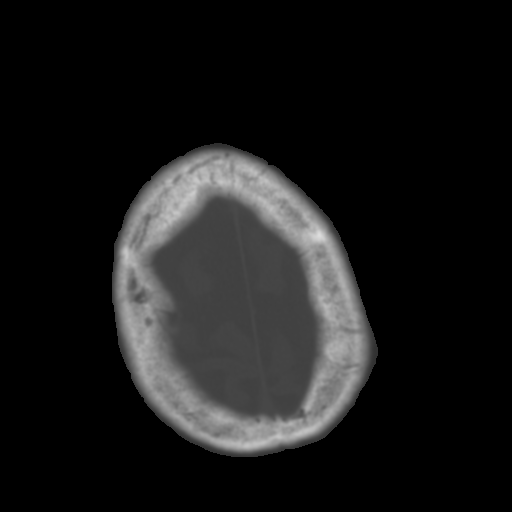
[im 29/36  brain]
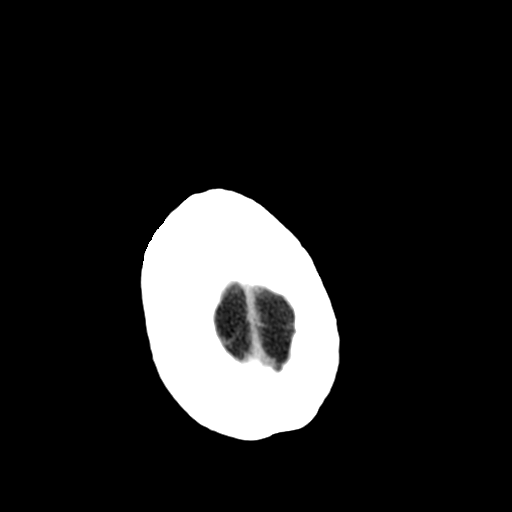
[im 32/36  brain]
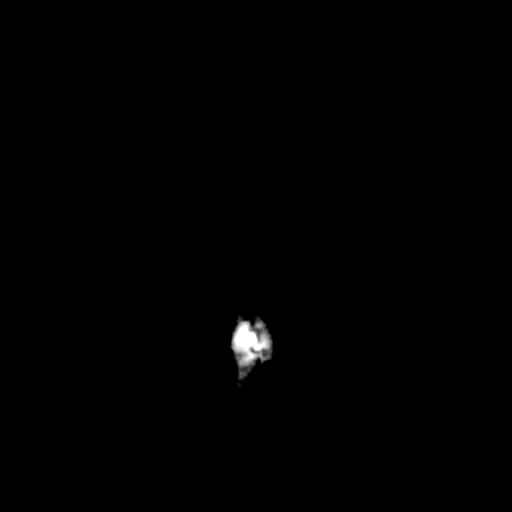
[im 34/36  brain]
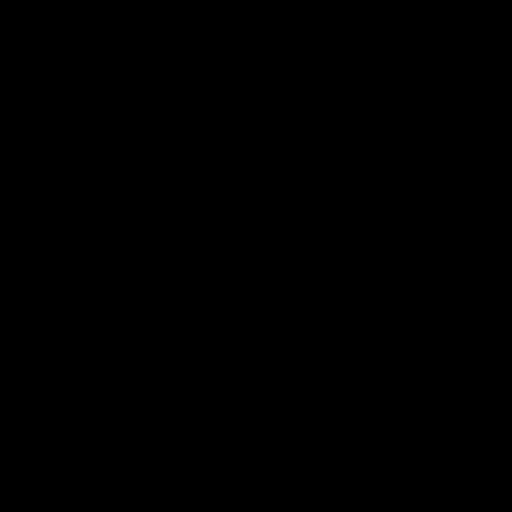

[16 of 30 positions shown; findings below may reference images not displayed]

FINDINGS: No intracranial hemorrhage.

Right middle cerebral artery branch vessel appears slightly dense
new from prior examination.  Small thrombus not excluded.

Streak artifact extends through the posterior fossa structures.  No
obvious large posterior fossa infarct.

Prominent small vessel disease type changes.

Global atrophy without hydrocephalus.

No intracranial mass lesion detected on this unenhanced exam..
IMPRESSION: No intracranial hemorrhage.

Right middle cerebral artery branch vessel appears slightly dense
new from prior examination.  Small thrombus not excluded.

Streak artifact extends through the posterior fossa structures.  No
obvious large posterior fossa infarct.

Prominent small vessel disease type changes.

## 2014-07-19 IMAGING — CR DG CHEST 1V
1 series · 1 of 1 positions shown · non-contrast
Comparison: August 24, 2012.

CLINICAL DATA: Cough, fever

CHEST - 1 VIEW

[view not recorded]
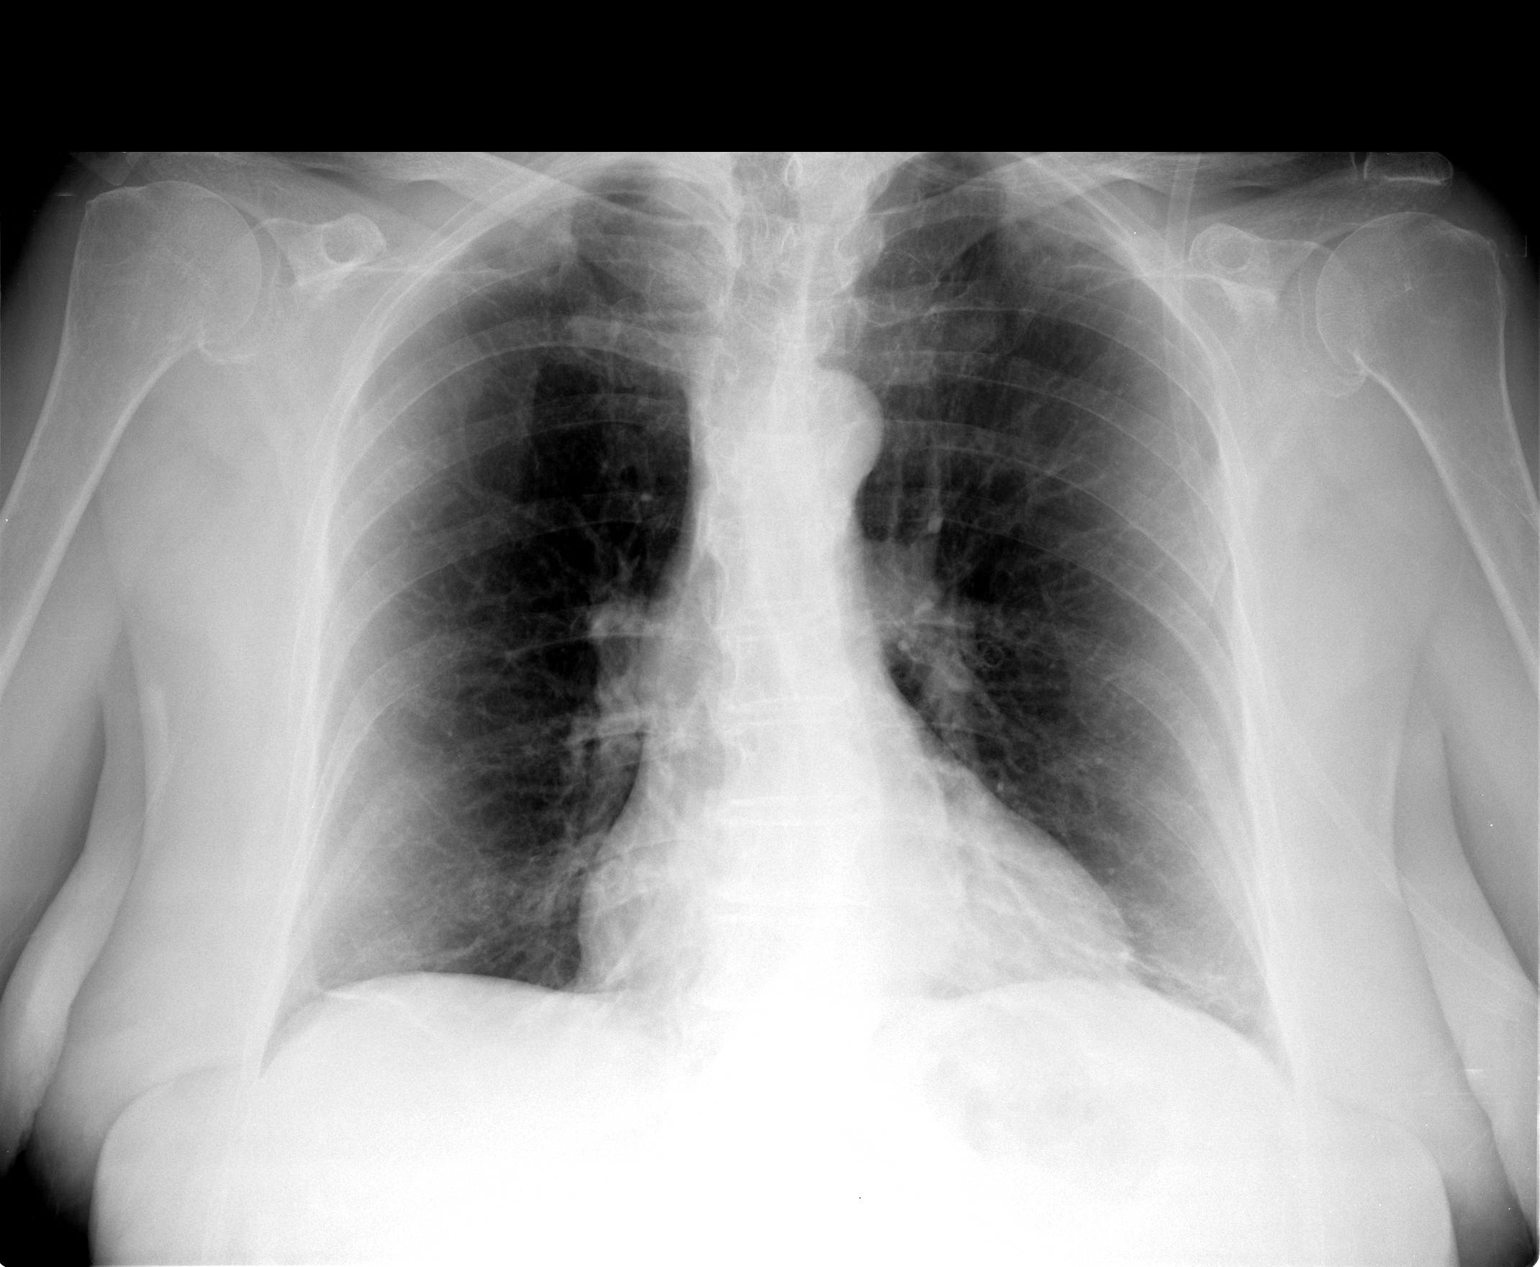

[1 of 1 positions shown; findings below may reference images not displayed]

FINDINGS: Cardiomediastinal silhouette appears normal.  No acute
pulmonary disease is noted.  Bony thorax is intact.  Hyperinflation
of the lungs is again noted and unchanged compared to prior exam.
IMPRESSION: No acute cardiopulmonary abnormality seen.

## 2014-07-19 IMAGING — US US CAROTID DUPLEX BILAT
1 series · 13 of 24 positions shown · non-contrast
Comparison: CT scan of the head obtained earlier today at [DATE]
a.m.

CLINICAL DATA: Syncope

BILATERAL CAROTID DUPLEX ULTRASOUND
TECHNIQUE: Gray scale imaging, color Doppler and duplex ultrasound
was performed of bilateral carotid and vertebral arteries in the
neck.

[Series 1: us carotid duplex bilat · 0.07mm/px · 13 of 73 slices shown]
[im 1/73]
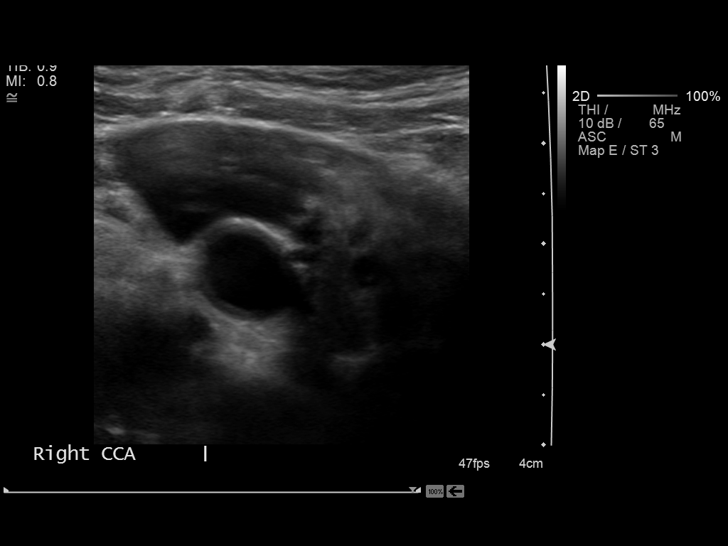
[im 7/73]
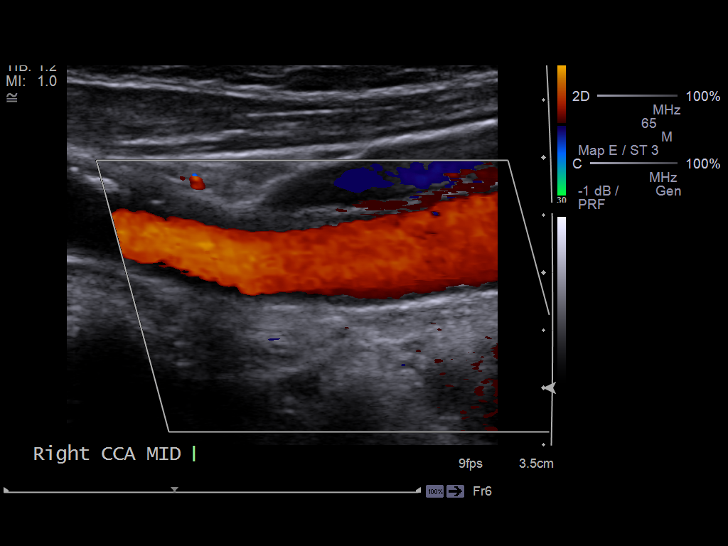
[im 13/73]
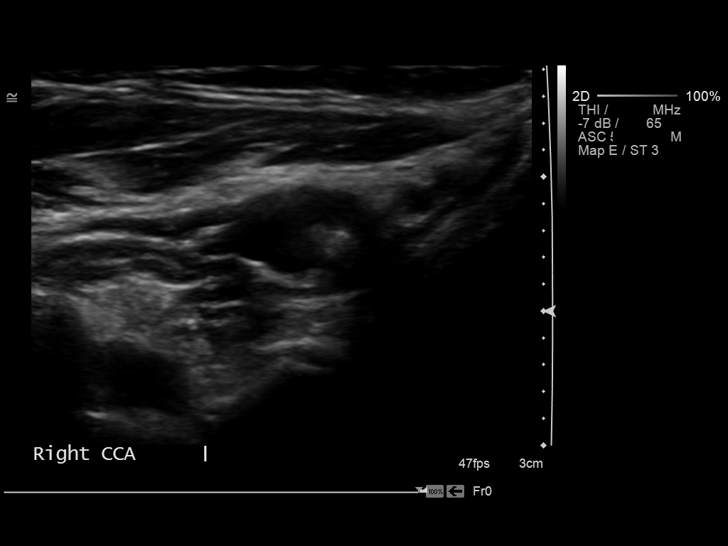
[im 19/73]
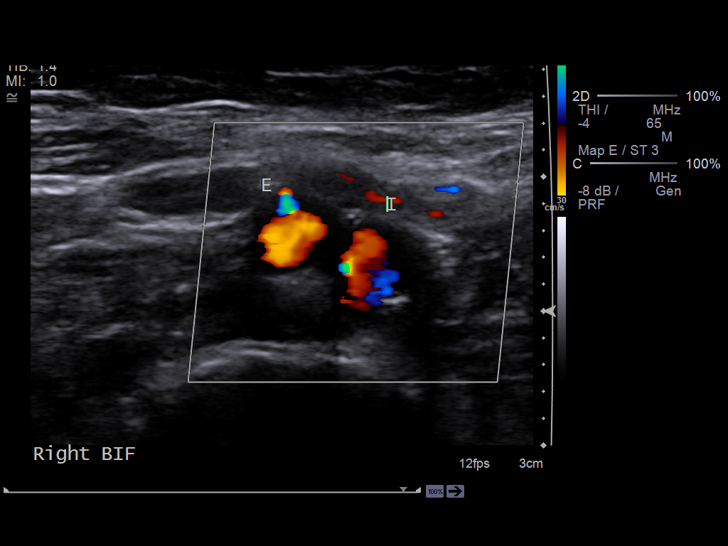
[im 26/73]
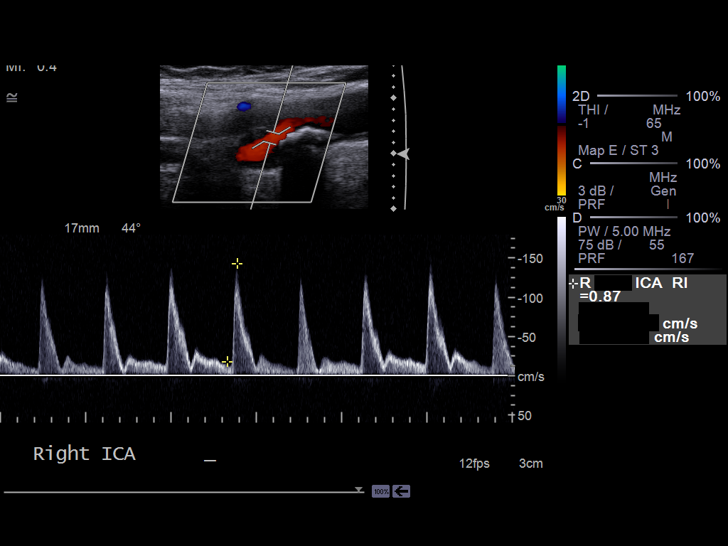
[im 32/73]
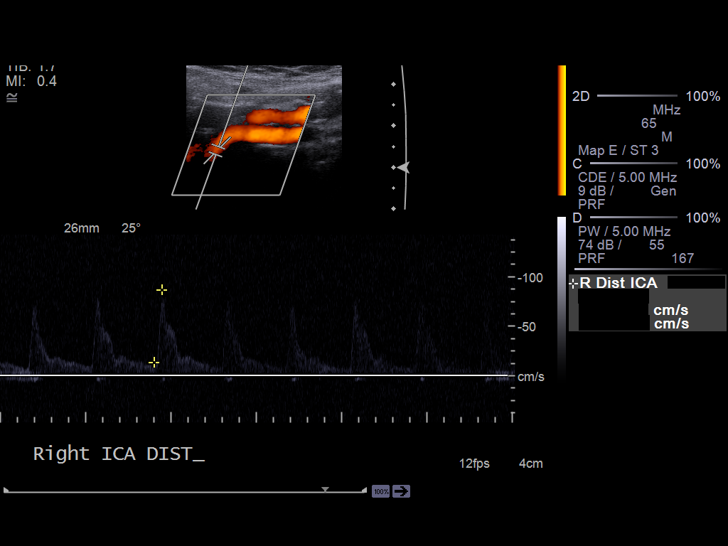
[im 38/73]
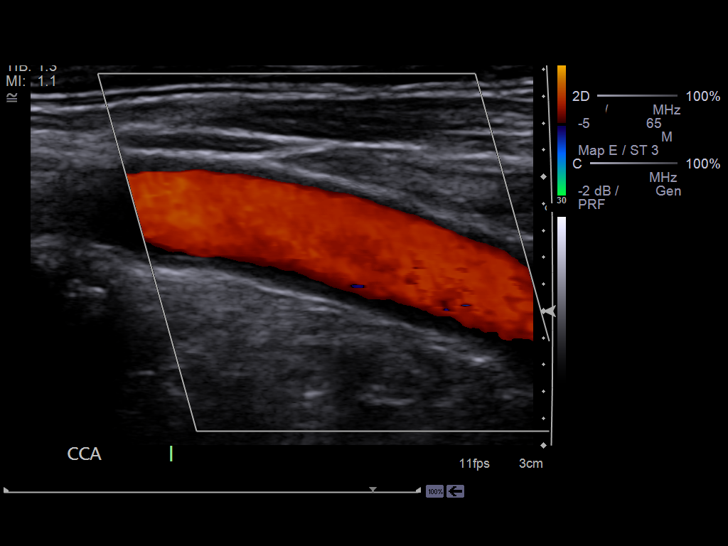
[im 41/73]
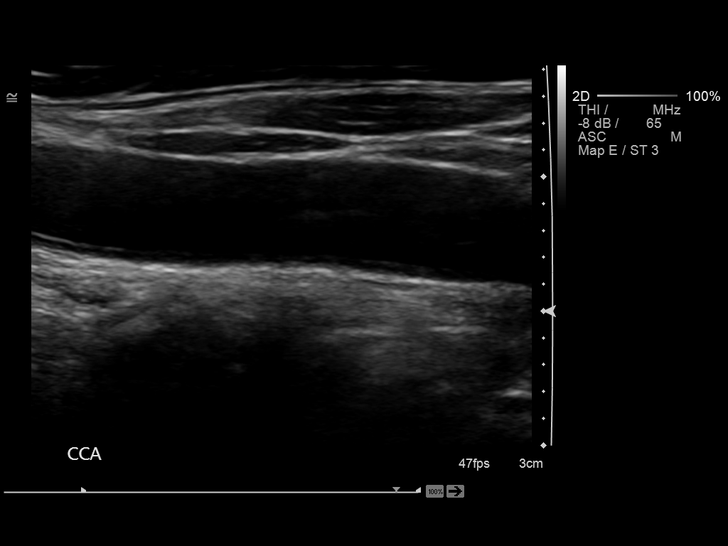
[im 47/73]
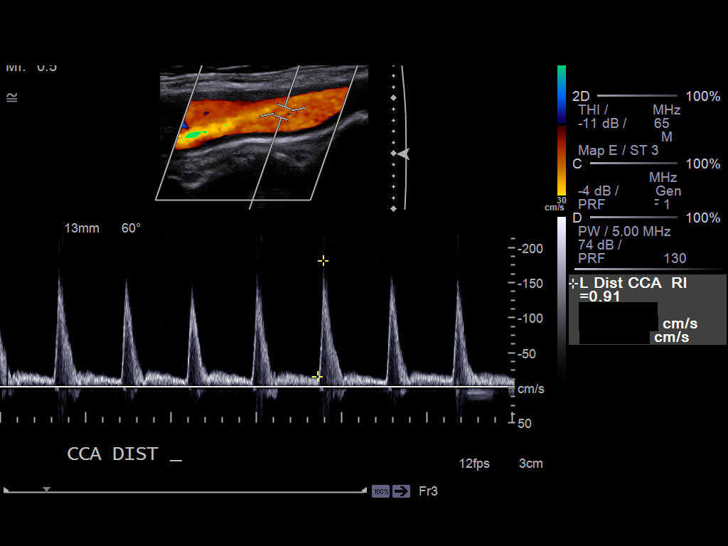
[im 54/73]
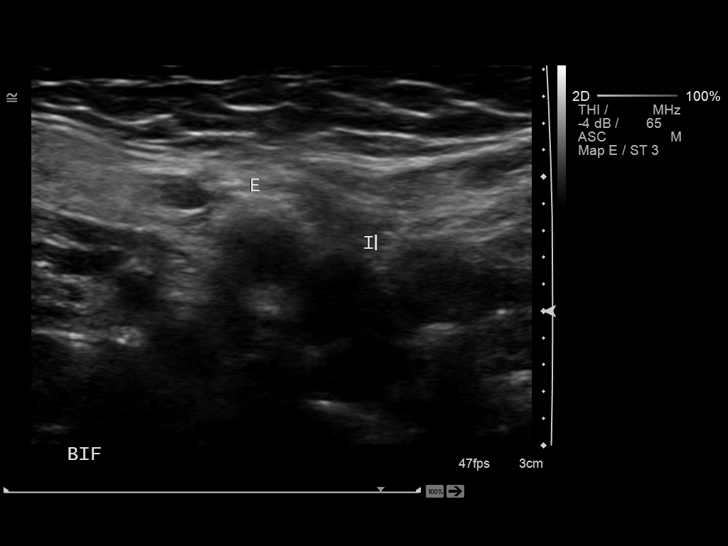
[im 60/73]
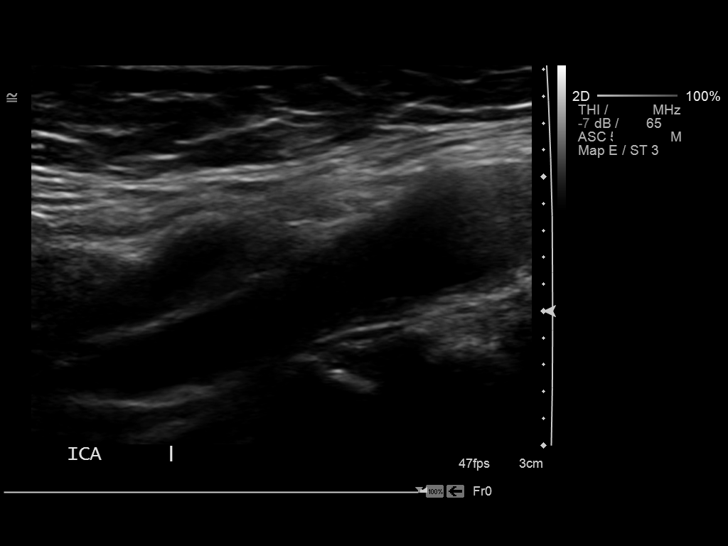
[im 66/73]
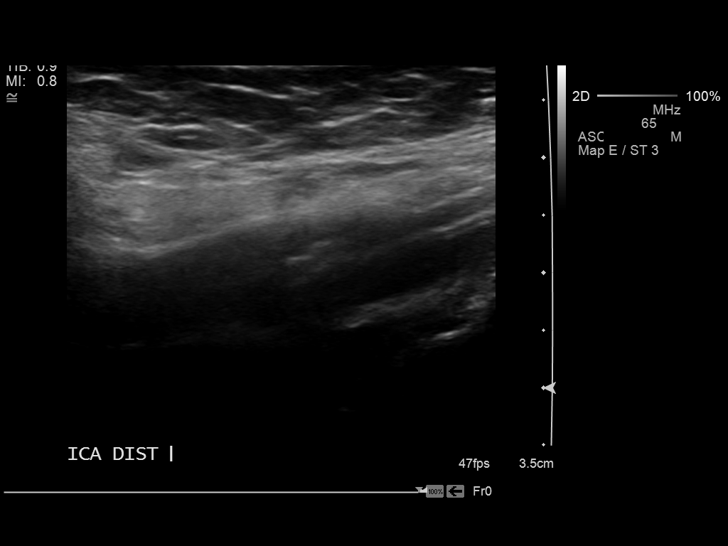
[im 73/73]
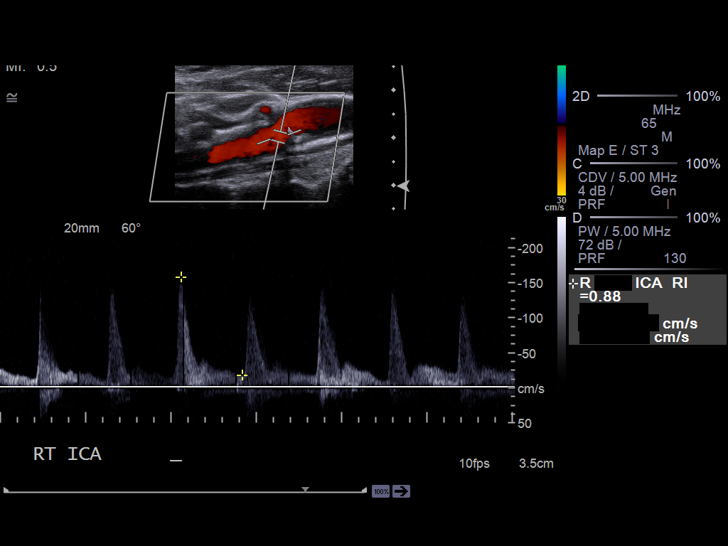

[13 of 24 positions shown; findings below may reference images not displayed]

Criteria:  Quantification of carotid stenosis is based on velocity
parameters that correlate the residual internal carotid diameter
with NASCET-based stenosis levels, using the diameter of the distal
internal carotid lumen as the denominator for stenosis measurement.

The following velocity measurements were obtained:

                 PEAK SYSTOLIC/END DIASTOLIC
RIGHT
ICA:                        165/13cm/sec
CCA:                        87/8cm/sec
SYSTOLIC ICA/CCA RATIO:
DIASTOLIC ICA/CCA RATIO:
ECA:                        154cm/sec

LEFT
ICA:                        131/14cm/sec
CCA:                        91/10cm/sec
SYSTOLIC ICA/CCA RATIO:
DIASTOLIC ICA/CCA RATIO:
ECA:                        228cm/sec
FINDINGS: RIGHT CAROTID ARTERY: Intimal medial thickening noted in the common
carotid artery.  Heterogeneous atherosclerotic plaque is noted in
the distal common carotid artery, carotid bulb and proximal
internal carotid artery.  Calcified atherosclerotic plaque in the
proximal internal carotid artery results in [DATE]% estimated
diameter stenosis.

RIGHT VERTEBRAL ARTERY:  Patent with normal antegrade flow.

LEFT CAROTID ARTERY: Intimal medial thickening noted in the distal
common carotid artery.  There is mild heterogeneous atherosclerotic
plaque in the distal common carotid artery extending into the
proximal internal and external carotid arteries.  Focal narrowing
and elevated peak systolic velocity in the proximal external
carotid artery likely results in greater than 50% diameter
stenosis.  Elevated peak systolic velocity in the distal left
internal carotid artery may be exaggerated.

LEFT VERTEBRAL ARTERY:  Patent with normal antegrade flow.
IMPRESSION: 1.  Heterogeneous partially calcified plaque in the proximal right
internal carotid artery results in [DATE]% diameter stenosis.

2.  Mild heterogeneous plaque on the left results in elevated peak
systolic velocities in the distal internal carotid artery, however
there is no significant plaque in the region of the elevated
velocity.  Diameter stenosis is likely less than 50%.

3.  Greater than 50% diameter stenosis of the proximal left
external carotid artery

4.  Vertebral arteries are patent with antegrade flow.

[REDACTED]

## 2014-10-23 ENCOUNTER — Other Ambulatory Visit (HOSPITAL_COMMUNITY): Payer: Self-pay | Admitting: Family Medicine

## 2014-10-23 ENCOUNTER — Other Ambulatory Visit: Payer: Self-pay

## 2014-10-23 DIAGNOSIS — N644 Mastodynia: Secondary | ICD-10-CM

## 2014-11-17 ENCOUNTER — Encounter (HOSPITAL_COMMUNITY): Payer: Medicare HMO

## 2014-11-24 ENCOUNTER — Other Ambulatory Visit (HOSPITAL_COMMUNITY): Payer: Self-pay | Admitting: Family Medicine

## 2014-11-24 ENCOUNTER — Ambulatory Visit (HOSPITAL_COMMUNITY)
Admission: RE | Admit: 2014-11-24 | Discharge: 2014-11-24 | Disposition: A | Discharge status: Home / Self Care | Payer: Medicare HMO | Source: Ambulatory Visit | Attending: Family Medicine | Admitting: Family Medicine

## 2014-11-24 DIAGNOSIS — R52 Pain, unspecified: Secondary | ICD-10-CM

## 2014-11-24 DIAGNOSIS — N644 Mastodynia: Secondary | ICD-10-CM | POA: Insufficient documentation

## 2015-01-06 ENCOUNTER — Encounter: Payer: Self-pay | Admitting: *Deleted

## 2015-02-01 ENCOUNTER — Encounter: Payer: Self-pay | Admitting: Internal Medicine

## 2015-05-03 DIAGNOSIS — Z719 Counseling, unspecified: Secondary | ICD-10-CM | POA: Diagnosis not present

## 2015-05-03 DIAGNOSIS — S069X0D Unspecified intracranial injury without loss of consciousness, subsequent encounter: Secondary | ICD-10-CM | POA: Diagnosis not present

## 2015-05-03 DIAGNOSIS — E291 Testicular hypofunction: Secondary | ICD-10-CM | POA: Diagnosis not present

## 2015-05-03 DIAGNOSIS — E782 Mixed hyperlipidemia: Secondary | ICD-10-CM | POA: Diagnosis not present

## 2015-05-03 DIAGNOSIS — Z6826 Body mass index (BMI) 26.0-26.9, adult: Secondary | ICD-10-CM | POA: Diagnosis not present

## 2015-05-03 DIAGNOSIS — Z0000 Encounter for general adult medical examination without abnormal findings: Secondary | ICD-10-CM | POA: Diagnosis not present

## 2015-05-03 DIAGNOSIS — Z23 Encounter for immunization: Secondary | ICD-10-CM | POA: Diagnosis not present

## 2015-05-03 DIAGNOSIS — R7309 Other abnormal glucose: Secondary | ICD-10-CM | POA: Diagnosis not present

## 2015-05-03 DIAGNOSIS — I10 Essential (primary) hypertension: Secondary | ICD-10-CM | POA: Diagnosis not present

## 2015-05-03 DIAGNOSIS — E663 Overweight: Secondary | ICD-10-CM | POA: Diagnosis not present

## 2015-05-03 DIAGNOSIS — Z1389 Encounter for screening for other disorder: Secondary | ICD-10-CM | POA: Diagnosis not present

## 2015-05-03 DIAGNOSIS — J449 Chronic obstructive pulmonary disease, unspecified: Secondary | ICD-10-CM | POA: Diagnosis not present

## 2015-05-11 DIAGNOSIS — E782 Mixed hyperlipidemia: Secondary | ICD-10-CM | POA: Diagnosis not present

## 2015-08-24 DIAGNOSIS — Z0100 Encounter for examination of eyes and vision without abnormal findings: Secondary | ICD-10-CM | POA: Diagnosis not present

## 2015-08-24 DIAGNOSIS — H5200 Hypermetropia, unspecified eye: Secondary | ICD-10-CM | POA: Diagnosis not present

## 2015-08-24 DIAGNOSIS — I10 Essential (primary) hypertension: Secondary | ICD-10-CM | POA: Diagnosis not present

## 2015-08-27 DIAGNOSIS — Z0100 Encounter for examination of eyes and vision without abnormal findings: Secondary | ICD-10-CM | POA: Diagnosis not present

## 2015-10-12 DIAGNOSIS — I10 Essential (primary) hypertension: Secondary | ICD-10-CM | POA: Diagnosis not present

## 2015-10-12 DIAGNOSIS — Z6826 Body mass index (BMI) 26.0-26.9, adult: Secondary | ICD-10-CM | POA: Diagnosis not present

## 2015-10-12 DIAGNOSIS — Z1389 Encounter for screening for other disorder: Secondary | ICD-10-CM | POA: Diagnosis not present

## 2015-10-12 DIAGNOSIS — E782 Mixed hyperlipidemia: Secondary | ICD-10-CM | POA: Diagnosis not present

## 2015-10-13 DIAGNOSIS — I10 Essential (primary) hypertension: Secondary | ICD-10-CM | POA: Diagnosis not present

## 2016-01-24 DIAGNOSIS — R7309 Other abnormal glucose: Secondary | ICD-10-CM | POA: Diagnosis not present

## 2016-01-24 DIAGNOSIS — Z1389 Encounter for screening for other disorder: Secondary | ICD-10-CM | POA: Diagnosis not present

## 2016-01-24 DIAGNOSIS — E663 Overweight: Secondary | ICD-10-CM | POA: Diagnosis not present

## 2016-01-24 DIAGNOSIS — E539 Vitamin B deficiency, unspecified: Secondary | ICD-10-CM | POA: Diagnosis not present

## 2016-01-24 DIAGNOSIS — Z6827 Body mass index (BMI) 27.0-27.9, adult: Secondary | ICD-10-CM | POA: Diagnosis not present

## 2016-05-08 DIAGNOSIS — Z6826 Body mass index (BMI) 26.0-26.9, adult: Secondary | ICD-10-CM | POA: Diagnosis not present

## 2016-05-08 DIAGNOSIS — I272 Pulmonary hypertension, unspecified: Secondary | ICD-10-CM | POA: Diagnosis not present

## 2016-05-08 DIAGNOSIS — Z1389 Encounter for screening for other disorder: Secondary | ICD-10-CM | POA: Diagnosis not present

## 2016-05-08 DIAGNOSIS — R7309 Other abnormal glucose: Secondary | ICD-10-CM | POA: Diagnosis not present

## 2016-05-08 DIAGNOSIS — E782 Mixed hyperlipidemia: Secondary | ICD-10-CM | POA: Diagnosis not present

## 2016-05-08 DIAGNOSIS — Z23 Encounter for immunization: Secondary | ICD-10-CM | POA: Diagnosis not present

## 2016-05-08 DIAGNOSIS — I10 Essential (primary) hypertension: Secondary | ICD-10-CM | POA: Diagnosis not present

## 2016-05-08 DIAGNOSIS — Z0001 Encounter for general adult medical examination with abnormal findings: Secondary | ICD-10-CM | POA: Diagnosis not present

## 2016-05-08 DIAGNOSIS — E663 Overweight: Secondary | ICD-10-CM | POA: Diagnosis not present

## 2016-05-08 DIAGNOSIS — I701 Atherosclerosis of renal artery: Secondary | ICD-10-CM | POA: Diagnosis not present

## 2016-05-08 DIAGNOSIS — E785 Hyperlipidemia, unspecified: Secondary | ICD-10-CM | POA: Diagnosis not present

## 2016-05-08 DIAGNOSIS — J449 Chronic obstructive pulmonary disease, unspecified: Secondary | ICD-10-CM | POA: Diagnosis not present

## 2016-05-16 ENCOUNTER — Other Ambulatory Visit (HOSPITAL_COMMUNITY): Payer: Self-pay | Admitting: Family Medicine

## 2016-06-15 DIAGNOSIS — D473 Essential (hemorrhagic) thrombocythemia: Secondary | ICD-10-CM | POA: Diagnosis not present

## 2016-06-23 ENCOUNTER — Encounter (HOSPITAL_COMMUNITY): Payer: Self-pay | Admitting: Emergency Medicine

## 2016-06-23 ENCOUNTER — Emergency Department (HOSPITAL_COMMUNITY): Payer: Medicare HMO

## 2016-06-23 ENCOUNTER — Observation Stay (HOSPITAL_COMMUNITY)
Admission: EM | Admit: 2016-06-23 | Discharge: 2016-06-24 | Disposition: A | Discharge status: Home / Self Care | Payer: Medicare HMO | Attending: Internal Medicine | Admitting: Internal Medicine

## 2016-06-23 DIAGNOSIS — R569 Unspecified convulsions: Secondary | ICD-10-CM

## 2016-06-23 DIAGNOSIS — E119 Type 2 diabetes mellitus without complications: Secondary | ICD-10-CM | POA: Diagnosis not present

## 2016-06-23 DIAGNOSIS — E118 Type 2 diabetes mellitus with unspecified complications: Secondary | ICD-10-CM

## 2016-06-23 DIAGNOSIS — E876 Hypokalemia: Secondary | ICD-10-CM

## 2016-06-23 DIAGNOSIS — G9340 Encephalopathy, unspecified: Secondary | ICD-10-CM | POA: Diagnosis present

## 2016-06-23 DIAGNOSIS — I10 Essential (primary) hypertension: Secondary | ICD-10-CM | POA: Diagnosis present

## 2016-06-23 DIAGNOSIS — F17210 Nicotine dependence, cigarettes, uncomplicated: Secondary | ICD-10-CM | POA: Diagnosis not present

## 2016-06-23 DIAGNOSIS — Z79899 Other long term (current) drug therapy: Secondary | ICD-10-CM | POA: Insufficient documentation

## 2016-06-23 DIAGNOSIS — R69 Illness, unspecified: Secondary | ICD-10-CM | POA: Diagnosis not present

## 2016-06-23 DIAGNOSIS — R4182 Altered mental status, unspecified: Secondary | ICD-10-CM | POA: Diagnosis not present

## 2016-06-23 DIAGNOSIS — R739 Hyperglycemia, unspecified: Secondary | ICD-10-CM

## 2016-06-23 LAB — COMPREHENSIVE METABOLIC PANEL
ALBUMIN: 4.4 g/dL (ref 3.5–5.0)
ALK PHOS: 64 U/L (ref 38–126)
ALT: 22 U/L (ref 14–54)
ANION GAP: 8 (ref 5–15)
AST: 22 U/L (ref 15–41)
BILIRUBIN TOTAL: 0.7 mg/dL (ref 0.3–1.2)
BUN: 13 mg/dL (ref 6–20)
CO2: 29 mmol/L (ref 22–32)
CREATININE: 0.87 mg/dL (ref 0.44–1.00)
Calcium: 9.7 mg/dL (ref 8.9–10.3)
Chloride: 98 mmol/L — ABNORMAL LOW (ref 101–111)
GFR calc Af Amer: >60 mL/min (ref >60)
GFR calc non Af Amer: >60 mL/min (ref >60)
GLUCOSE: 172 mg/dL — AB (ref 65–99)
Potassium: 3.3 mmol/L — ABNORMAL LOW (ref 3.5–5.1)
Sodium: 135 mmol/L (ref 135–145)
TOTAL PROTEIN: 8.1 g/dL (ref 6.5–8.1)

## 2016-06-23 LAB — URINALYSIS, ROUTINE W REFLEX MICROSCOPIC
Bilirubin Urine: NEGATIVE
GLUCOSE, UA: NEGATIVE mg/dL
HGB URINE DIPSTICK: NEGATIVE
KETONES UR: NEGATIVE mg/dL
Leukocytes, UA: NEGATIVE
Nitrite: NEGATIVE
PH: 6 (ref 5.0–8.0)
PROTEIN: NEGATIVE mg/dL
Specific Gravity, Urine: 1.008 (ref 1.005–1.030)

## 2016-06-23 LAB — TROPONIN I

## 2016-06-23 LAB — CBC
HCT: 45.2 % (ref 36.0–46.0)
HEMOGLOBIN: 15 g/dL (ref 12.0–15.0)
MCH: 30.2 pg (ref 26.0–34.0)
MCHC: 33.2 g/dL (ref 30.0–36.0)
MCV: 90.9 fL (ref 78.0–100.0)
PLATELETS: 386 10*3/uL (ref 150–400)
RBC: 4.97 MIL/uL (ref 3.87–5.11)
RDW: 13.6 % (ref 11.5–15.5)
WBC: 10.2 10*3/uL (ref 4.0–10.5)

## 2016-06-23 LAB — CBG MONITORING, ED: GLUCOSE-CAPILLARY: 169 mg/dL — AB (ref 65–99)

## 2016-06-23 LAB — GLUCOSE, CAPILLARY: Glucose-Capillary: 122 mg/dL — ABNORMAL HIGH (ref 65–99)

## 2016-06-23 LAB — TSH: TSH: 2.218 u[IU]/mL (ref 0.350–4.500)

## 2016-06-23 MED ORDER — MAGNESIUM SULFATE IN D5W 1-5 GM/100ML-% IV SOLN
INTRAVENOUS | Status: AC
  Filled 2016-06-23: qty 100

## 2016-06-23 MED ORDER — INSULIN ASPART 100 UNIT/ML ~~LOC~~ SOLN: 0.0000 [IU] | Freq: Every day | SUBCUTANEOUS | Status: DC

## 2016-06-23 MED ORDER — HYDRALAZINE HCL 20 MG/ML IJ SOLN: 5.0000 mg | INTRAMUSCULAR | Status: DC | PRN

## 2016-06-23 MED ORDER — CLOPIDOGREL BISULFATE 75 MG PO TABS
75.0000 mg | ORAL_TABLET | Freq: Every day | ORAL | Status: DC
  Administered 2016-06-24: 75 mg via ORAL
  Filled 2016-06-23: qty 1

## 2016-06-23 MED ORDER — ADULT MULTIVITAMIN W/MINERALS CH
1.0000 | ORAL_TABLET | Freq: Every day | ORAL | Status: DC
  Administered 2016-06-24: 1 via ORAL
  Filled 2016-06-23: qty 1

## 2016-06-23 MED ORDER — NICOTINE 21 MG/24HR TD PT24
21.0000 mg | MEDICATED_PATCH | Freq: Every day | TRANSDERMAL | Status: DC
  Administered 2016-06-23 – 2016-06-24 (×2): 21 mg via TRANSDERMAL
  Filled 2016-06-23 (×2): qty 1

## 2016-06-23 MED ORDER — ACETAMINOPHEN 325 MG PO TABS: 650.0000 mg | ORAL_TABLET | Freq: Four times a day (QID) | ORAL | Status: DC | PRN

## 2016-06-23 MED ORDER — ENOXAPARIN SODIUM 40 MG/0.4ML ~~LOC~~ SOLN
40.0000 mg | SUBCUTANEOUS | Status: DC
  Administered 2016-06-23: 40 mg via SUBCUTANEOUS
  Filled 2016-06-23: qty 0.4

## 2016-06-23 MED ORDER — INSULIN ASPART 100 UNIT/ML ~~LOC~~ SOLN
0.0000 [IU] | Freq: Three times a day (TID) | SUBCUTANEOUS | Status: DC
Start: 2016-06-24 — End: 2016-06-24

## 2016-06-23 MED ORDER — MAGNESIUM SULFATE IN D5W 1-5 GM/100ML-% IV SOLN
1.0000 g | Freq: Once | INTRAVENOUS | Status: AC
  Administered 2016-06-23: 1 g via INTRAVENOUS
  Filled 2016-06-23: qty 100

## 2016-06-23 MED ORDER — SIMVASTATIN 20 MG PO TABS: 40.0000 mg | ORAL_TABLET | Freq: Every day | ORAL | Status: DC

## 2016-06-23 MED ORDER — LEVETIRACETAM 500 MG PO TABS
500.0000 mg | ORAL_TABLET | Freq: Two times a day (BID) | ORAL | Status: DC
  Administered 2016-06-23 – 2016-06-24 (×2): 500 mg via ORAL
  Filled 2016-06-23 (×2): qty 1

## 2016-06-23 MED ORDER — ACETAMINOPHEN 650 MG RE SUPP: 650.0000 mg | Freq: Four times a day (QID) | RECTAL | Status: DC | PRN

## 2016-06-23 MED ORDER — METOPROLOL TARTRATE 25 MG PO TABS
25.0000 mg | ORAL_TABLET | Freq: Two times a day (BID) | ORAL | Status: DC
  Administered 2016-06-23 – 2016-06-24 (×2): 25 mg via ORAL
  Filled 2016-06-23 (×2): qty 1

## 2016-06-23 MED ORDER — HYDROCODONE-ACETAMINOPHEN 5-325 MG PO TABS: 1.0000 | ORAL_TABLET | ORAL | Status: DC | PRN

## 2016-06-23 MED ORDER — POLYETHYLENE GLYCOL 3350 17 G PO PACK: 17.0000 g | PACK | Freq: Every day | ORAL | Status: DC | PRN

## 2016-06-23 MED ORDER — ONDANSETRON HCL 4 MG/2ML IJ SOLN: 4.0000 mg | Freq: Four times a day (QID) | INTRAMUSCULAR | Status: DC | PRN

## 2016-06-23 MED ORDER — MAGNESIUM SULFATE 2 GM/50ML IV SOLN
1.0000 g | Freq: Once | INTRAVENOUS | Status: DC
  Filled 2016-06-23 (×2): qty 50

## 2016-06-23 MED ORDER — ONDANSETRON HCL 4 MG PO TABS: 4.0000 mg | ORAL_TABLET | Freq: Four times a day (QID) | ORAL | Status: DC | PRN

## 2016-06-23 MED ORDER — POTASSIUM CHLORIDE IN NACL 40-0.9 MEQ/L-% IV SOLN
INTRAVENOUS | Status: AC
  Administered 2016-06-23: 100 mL/h via INTRAVENOUS

## 2016-06-23 MED ORDER — CALCIUM CARBONATE-VITAMIN D 500-200 MG-UNIT PO TABS
1.0000 | ORAL_TABLET | Freq: Every day | ORAL | Status: DC
  Administered 2016-06-24: 1 via ORAL
  Filled 2016-06-23: qty 1

## 2016-06-23 MED ORDER — LOSARTAN POTASSIUM 50 MG PO TABS
100.0000 mg | ORAL_TABLET | Freq: Every day | ORAL | Status: DC
  Administered 2016-06-24: 100 mg via ORAL
  Filled 2016-06-23: qty 2

## 2016-06-23 NOTE — ED Triage Notes (Signed)
Pt lives with son and his wife. Oriented to person only. Pt denies pain, does not know why she is here. Per Son pt smokes 2 packs a day, usually up at 5am. Pt has not smoked today and would not eat breakfast
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
## 2016-06-23 NOTE — ED Notes (Signed)
At this time pt reports name, DOB, location, and recent holiday correctly. Pt unable to state year, month, or current president.

## 2016-06-23 NOTE — ED Notes (Signed)
Pt transported to CT at this time.

## 2016-06-23 NOTE — ED Triage Notes (Signed)
Pt felt dizzy this morning

## 2016-06-23 NOTE — ED Notes (Signed)
Per pt's family pt began not acting her normal self yesterday "around lunch time", was having difficulty lighting a cigarette. Family states pt is a 2 PPD smoker, last cigarette was last night. During assessment pt having trouble remembering recent holiday, month, year, name. Reports HA. MD Lacinda Axon notified.
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
## 2016-06-24 DIAGNOSIS — G9340 Encephalopathy, unspecified: Secondary | ICD-10-CM | POA: Diagnosis not present

## 2016-06-24 LAB — BASIC METABOLIC PANEL
ANION GAP: 7 (ref 5–15)
BUN: 15 mg/dL (ref 6–20)
CHLORIDE: 104 mmol/L (ref 101–111)
CO2: 27 mmol/L (ref 22–32)
Calcium: 8.9 mg/dL (ref 8.9–10.3)
Creatinine, Ser: 0.76 mg/dL (ref 0.44–1.00)
Glucose, Bld: 96 mg/dL (ref 65–99)
POTASSIUM: 3.9 mmol/L (ref 3.5–5.1)
SODIUM: 138 mmol/L (ref 135–145)

## 2016-06-24 LAB — VITAMIN B12: Vitamin B-12: 429 pg/mL (ref 180–914)

## 2016-06-24 LAB — GLUCOSE, CAPILLARY
GLUCOSE-CAPILLARY: 97 mg/dL (ref 65–99)
Glucose-Capillary: 100 mg/dL — ABNORMAL HIGH (ref 65–99)

## 2016-06-24 LAB — T4, FREE: FREE T4: 1.08 ng/dL (ref 0.61–1.12)

## 2016-06-24 MED ORDER — LEVETIRACETAM 750 MG PO TABS: 750.0000 mg | ORAL_TABLET | Freq: Two times a day (BID) | ORAL | 0 refills | Status: AC

## 2016-06-24 NOTE — Progress Notes (Signed)
Understanding of discharge instructions

## 2016-06-24 NOTE — Discharge Summary (Signed)
[name] [id number] DOB: Mar 04, 1936 DOA: 06/23/2016  PCP: Purvis Kilts, MD  Admit date: 06/23/2016  Discharge date: 06/24/2016  Admitted From: Home  Disposition:  Home   Recommendations for Outpatient Follow-up:   Follow up with PCP in 1-2 weeks  PCP Please obtain BMP/CBC, 2 view CXR in 1week,  (see Discharge instructions)   PCP Please follow up on the following pending results: Pending A1c, RPR, B-12 and TSH   Home Health: None   Equipment/Devices: None  Consultations: None Discharge Condition: Fair   CODE STATUS: Full   Diet Recommendation:  Heart Healthy    Chief Complaint  Patient presents with  . Altered Mental Status     Brief history of present illness from the day of admission and additional interim summary     Shannon Maldonado is a 80 y.o. female with medical history significant for type 2 diabetes mellitus, hypertension, coronary artery disease, and seizures who presents to the emergency department with confusion for the past day and a half. Patient is accompanied by her son's to provide the history. She had reportedly been in her usual state of health until yesterday afternoon when she was noted to have difficulty lighting a cigarette and had to have her son help her with this. She was later unable to put on her coat to go outside by herself and again required intensive assistance with this.   She was noted to be mildly confused last night, but today is unable to identify her grandson by name or relation, has not been eating, and has not smoked or ask for a cigarette despite her usual 2 pack per day habit. She is reported to have a seizure disorder from a remote closed head injury, but no recent seizure activity has been noted. There has been no fevers or chills and the patient has  not been coughing, vomiting, or experiencing diarrhea. There was no recent fall or head trauma. Patient has not complained of any pain or discomfort.   Hospital issues addressed    1. Acute encephalopathy most likely due to a breakthrough seizure - She had similar presentation in the past, she follows with neurologist at [facility], already on Keppra, with supportive care morning of 06/24/2016 she is back to her baseline, no focal deficits, no headache, oriented 3. Following commands appropriately. My suspicion is she had a breakthrough seizure, she was on Keppra 500 twice a day which will be bumped to 750 twice a day. Have requested her son to make sure patient follows with PCP and her neurologist at [facility] within a week. We do not have EEG for 30 available during the weekend. This can be done as an outpatient procedure by the neurologist.  2. Hypertension - BP stable we'll continue home regimen with close blood pressure monitoring by PCP.  3. Hyperglycemia -  diet-controlled continue. A1c pending.     4. Hypokalemia - replaced and stable.  5. Current smoker - counseled to quit.    Discharge diagnosis  Principal Problem:   Acute encephalopathy Active Problems:   Seizure ([place])   DM type 2 (diabetes mellitus, type 2) (HCC)   Cigarette smoker   HTN (hypertension)   Hypokalemia   Hyperglycemia    Discharge instructions    Discharge Instructions    Diet - low sodium heart healthy    Complete by:  As directed    Discharge instructions    Complete by:  As directed    Follow with Primary MD Purvis Kilts, MD in 7 days   Get CBC, CMP, 2 view Chest X ray checked  by Primary MD or SNF MD in 5-7 days ( we routinely change or add medications that can affect your baseline labs and fluid status, therefore we recommend that you get the mentioned basic workup next visit with your PCP, your PCP may decide not to get them or add new tests based on their  clinical decision)   Activity: As tolerated with Full fall precautions use walker/cane & assistance as needed   Disposition Home     Diet:  Heart Healthy    For Heart failure patients - Check your Weight same time everyday, if you gain over 2 pounds, or you develop in leg swelling, experience more shortness of breath or chest pain, call your Primary MD immediately. Follow Cardiac Low Salt Diet and 1.5 lit/day fluid restriction.   On your next visit with your primary care physician please Get Medicines reviewed and adjusted.   Please request your Prim.MD to go over all Hospital Tests and Procedure/Radiological results at the follow up, please get all Hospital records sent to your Prim MD by signing hospital release before you go home.   If you experience worsening of your admission symptoms, develop shortness of breath, life threatening emergency, suicidal or homicidal thoughts you must seek medical attention immediately by calling 911 or calling your MD immediately  if symptoms less severe.  You Must read complete instructions/literature along with all the possible adverse reactions/side effects for all the Medicines you take and that have been prescribed to you. Take any new Medicines after you have completely understood and accpet all the possible adverse reactions/side effects.   Do not drive, operate heavy machinery, perform activities at heights, swimming or participation in water activities or provide baby sitting services if your were admitted for syncope or siezures until you have seen by Primary MD or a Neurologist and advised to do so again.  Do not drive when taking Pain medications.    Do not take more than prescribed Pain, Sleep and Anxiety Medications  Special Instructions: If you have smoked or chewed Tobacco  in the last 2 yrs please stop smoking, stop any regular Alcohol  and or any Recreational drug use.  Wear Seat belts while driving.   Please note  You were  cared for by a hospitalist during your hospital stay. If you have any questions about your discharge medications or the care you received while you were in the hospital after you are discharged, you can call the unit and asked to speak with the hospitalist on call if the hospitalist that took care of you is not available. Once you are discharged, your primary care physician will handle any further medical issues. Please note that NO REFILLS for any discharge medications will be authorized once you are discharged, as it is imperative that you return to your primary care physician (or establish a relationship with a primary care physician if you do not  have one) for your aftercare needs so that they can reassess your need for medications and monitor your lab values.   Increase activity slowly    Complete by:  As directed       Discharge Medications   Allergies as of 06/24/2016      Reactions   Penicillins Swelling, Other (See Comments)   Reaction:  Unspecified swelling reaction  Has patient had a PCN reaction causing immediate rash, facial/tongue/throat swelling, SOB or lightheadedness with hypotension: Yes Has patient had a PCN reaction causing severe rash involving mucus membranes or skin necrosis: No Has patient had a PCN reaction that required hospitalization No Has patient had a PCN reaction occurring within the last 10 years: No If all of the above answers are "NO", then may proceed with Cephalosporin use.   Amoxicillin Swelling, Other (See Comments)   Reaction:  Unspecified swelling reaction  Has patient had a PCN reaction causing immediate rash, facial/tongue/throat swelling, SOB or lightheadedness with hypotension: Yes Has patient had a PCN reaction causing severe rash involving mucus membranes or skin necrosis: No Has patient had a PCN reaction that required hospitalization No Has patient had a PCN reaction occurring within the last 10 years: No If all of the above answers are "NO",  then may proceed with Cephalosporin use.      Medication List    TAKE these medications   calcium-vitamin D 500-200 MG-UNIT tablet Commonly known as:  OSCAL WITH D Take 1 tablet by mouth daily with breakfast.   clopidogrel 75 MG tablet Commonly known as:  PLAVIX Take 1 tablet (75 mg total) by mouth daily with breakfast.   levETIRAcetam 750 MG tablet Commonly known as:  KEPPRA Take 1 tablet (750 mg total) by mouth 2 (two) times daily. What changed:  medication strength  how much to take   losartan 100 MG tablet Commonly known as:  COZAAR Take 100 mg by mouth daily.   metoprolol tartrate 25 MG tablet Commonly known as:  LOPRESSOR Take 25 mg by mouth 2 (two) times daily.   multivitamin with minerals Tabs tablet Take 1 tablet by mouth daily.   naproxen sodium 220 MG tablet Commonly known as:  ANAPROX Take 440 mg by mouth every 8 (eight) hours as needed (for pain). For pain   simvastatin 40 MG tablet Commonly known as:  ZOCOR Take 40 mg by mouth daily.       Follow-up Information    Purvis Kilts, MD. Schedule an appointment as soon as possible for a visit in 1 week(s).   Specialty:  Family Medicine Why:  and your Neurologist Contact information: [address] [place] [id number] [phone]           Major procedures and Radiology Reports - PLEASE review detailed and final reports thoroughly  -         Ct Head Wo Contrast  Result Date: 06/23/2016 CLINICAL DATA:  Altered mental status. Difficulty leading a cigarette. EXAM: CT HEAD WITHOUT CONTRAST TECHNIQUE: Contiguous axial images were obtained from the base of the skull through the vertex without intravenous contrast. COMPARISON:  11/25/2012 CT head, MR brain 11/28/2012 FINDINGS: Brain: No evidence of acute infarction, hemorrhage, extra-axial collection, ventriculomegaly, or mass effect. Generalized cerebral atrophy. Periventricular white matter low attenuation likely secondary to  microangiopathy. Vascular: No hyperdense vessels or abnormal calcifications. Skull: Negative for fracture or focal lesion. Sinuses/Orbits: Visualized portions of the orbits are unremarkable. Visualized portions of the paranasal sinuses and mastoid air cells are unremarkable.  Other: None. IMPRESSION: 1. No acute intracranial pathology. 2. Chronic microvascular disease and cerebral atrophy. Electronically Signed   By: Kathreen Devoid   On: 06/23/2016 12:20    Micro Results     No results found for this or any previous visit (from the past 240 hour(s)).  Today   Subjective   Donley Redder today has no headache,no chest abdominal pain,no new weakness tingling or numbness, feels much better wants to go home today.     Objective   Blood pressure 132/80, pulse 74, temperature 98.3 F (36.8 C), temperature source Oral, resp. rate 20, height 5\' 6"  (1.676 m), weight 71.2 kg (157 lb), SpO2 93 %.   Intake/Output Summary (Last 24 hours) at 06/24/16 1247 Last data filed at 06/24/16 0539  Gross per 24 hour  Intake              120 ml  Output              525 ml  Net             -405 ml    Exam Awake Alert, Oriented x 3, No new F.N deficits, Normal affect [place].AT,PERRAL Supple Neck,No JVD, No cervical lymphadenopathy appriciated.  Symmetrical Chest wall movement, Good air movement bilaterally, CTAB RRR,No Gallops,Rubs or new Murmurs, No Parasternal Heave +ve B.Sounds, Abd Soft, Non tender, No organomegaly appriciated, No rebound -guarding or rigidity. No Cyanosis, Clubbing or edema, No new Rash or bruise   Data Review   CBC w Diff: Lab Results  Component Value Date   WBC 10.2 06/23/2016   HGB 15.0 06/23/2016   HCT 45.2 06/23/2016   PLT 386 06/23/2016   LYMPHOPCT 21 11/27/2012   MONOPCT 6 11/27/2012   EOSPCT 0 11/27/2012   BASOPCT 3 (H) 11/27/2012    CMP: Lab Results  Component Value Date   NA 138 06/24/2016   K 3.9 06/24/2016   CL 104 06/24/2016   CO2 27 06/24/2016   BUN 15  06/24/2016   CREATININE 0.76 06/24/2016   PROT 8.1 06/23/2016   ALBUMIN 4.4 06/23/2016   BILITOT 0.7 06/23/2016   ALKPHOS 64 06/23/2016   AST 22 06/23/2016   ALT 22 06/23/2016  .   Total Time in preparing paper work, data evaluation and todays exam - 35 minutes  Thurnell Lose M.D on 06/24/2016 at 12:47 PM  [facility]   Office  [phone]

## 2016-06-24 NOTE — Discharge Instructions (Signed)
Follow with Primary MD GOLDING, JOHN CABOT, MD in 7 days  ° °Get CBC, CMP, 2 view Chest X ray checked  by Primary MD or SNF MD in 5-7 days ( we routinely change or add medications that can affect your baseline labs and fluid status, therefore we recommend that you get the mentioned basic workup next visit with your PCP, your PCP may decide not to get them or add new tests based on their clinical decision) ° ° °Activity: As tolerated with Full fall precautions use walker/cane & assistance as needed ° ° °Disposition Home   ° ° °Diet:   Heart Healthy  ° °For Heart failure patients - Check your Weight same time everyday, if you gain over 2 pounds, or you develop in leg swelling, experience more shortness of breath or chest pain, call your Primary MD immediately. Follow Cardiac Low Salt Diet and 1.5 lit/day fluid restriction. ° ° °On your next visit with your primary care physician please Get Medicines reviewed and adjusted. ° ° °Please request your Prim.MD to go over all Hospital Tests and Procedure/Radiological results at the follow up, please get all Hospital records sent to your Prim MD by signing hospital release before you go home. ° ° °If you experience worsening of your admission symptoms, develop shortness of breath, life threatening emergency, suicidal or homicidal thoughts you must seek medical attention immediately by calling 911 or calling your MD immediately  if symptoms less severe. ° °You Must read complete instructions/literature along with all the possible adverse reactions/side effects for all the Medicines you take and that have been prescribed to you. Take any new Medicines after you have completely understood and accpet all the possible adverse reactions/side effects.  ° °Do not drive, operate heavy machinery, perform activities at heights, swimming or participation in water activities or provide baby sitting services if your were admitted for syncope or siezures until you have seen by Primary MD or  a Neurologist and advised to do so again. ° °Do not drive when taking Pain medications.  ° ° °Do not take more than prescribed Pain, Sleep and Anxiety Medications ° °Special Instructions: If you have smoked or chewed Tobacco  in the last 2 yrs please stop smoking, stop any regular Alcohol  and or any Recreational drug use. ° °Wear Seat belts while driving. ° ° °Please note ° °You were cared for by a hospitalist during your hospital stay. If you have any questions about your discharge medications or the care you received while you were in the hospital after you are discharged, you can call the unit and asked to speak with the hospitalist on call if the hospitalist that took care of you is not available. Once you are discharged, your primary care physician will handle any further medical issues. Please note that NO REFILLS for any discharge medications will be authorized once you are discharged, as it is imperative that you return to your primary care physician (or establish a relationship with a primary care physician if you do not have one) for your aftercare needs so that they can reassess your need for medications and monitor your lab values. ° °

## 2016-06-25 LAB — HIV ANTIBODY (ROUTINE TESTING W REFLEX): HIV SCREEN 4TH GENERATION: NONREACTIVE

## 2016-06-25 LAB — RPR: RPR: NONREACTIVE

## 2016-06-25 LAB — URINE CULTURE: Culture: NO GROWTH

## 2016-06-25 LAB — HEMOGLOBIN A1C
Hgb A1c MFr Bld: 5.8 % — ABNORMAL HIGH (ref 4.8–5.6)
Mean Plasma Glucose: 120 mg/dL

## 2016-07-18 ENCOUNTER — Other Ambulatory Visit (HOSPITAL_COMMUNITY): Payer: Self-pay | Admitting: Family Medicine

## 2016-07-18 DIAGNOSIS — N6310 Unspecified lump in the right breast, unspecified quadrant: Secondary | ICD-10-CM

## 2016-08-01 ENCOUNTER — Ambulatory Visit (HOSPITAL_COMMUNITY)
Admission: RE | Admit: 2016-08-01 | Discharge: 2016-08-01 | Disposition: A | Discharge status: Home / Self Care | Payer: Medicare HMO | Source: Ambulatory Visit | Attending: Family Medicine | Admitting: Family Medicine

## 2016-08-01 ENCOUNTER — Encounter (HOSPITAL_COMMUNITY): Payer: Self-pay

## 2016-08-01 DIAGNOSIS — R928 Other abnormal and inconclusive findings on diagnostic imaging of breast: Secondary | ICD-10-CM | POA: Diagnosis not present

## 2016-08-01 DIAGNOSIS — N6310 Unspecified lump in the right breast, unspecified quadrant: Secondary | ICD-10-CM | POA: Diagnosis present

## 2016-08-01 HISTORY — DX: Other abnormal and inconclusive findings on diagnostic imaging of breast: R92.8

## 2016-08-31 DIAGNOSIS — R404 Transient alteration of awareness: Secondary | ICD-10-CM | POA: Diagnosis not present

## 2016-08-31 DIAGNOSIS — Z7984 Long term (current) use of oral hypoglycemic drugs: Secondary | ICD-10-CM | POA: Diagnosis not present

## 2016-08-31 DIAGNOSIS — E785 Hyperlipidemia, unspecified: Secondary | ICD-10-CM | POA: Diagnosis not present

## 2016-08-31 DIAGNOSIS — I10 Essential (primary) hypertension: Secondary | ICD-10-CM | POA: Diagnosis not present

## 2016-08-31 DIAGNOSIS — Z79899 Other long term (current) drug therapy: Secondary | ICD-10-CM | POA: Diagnosis not present

## 2016-08-31 DIAGNOSIS — E119 Type 2 diabetes mellitus without complications: Secondary | ICD-10-CM | POA: Diagnosis not present

## 2016-09-09 DIAGNOSIS — R569 Unspecified convulsions: Secondary | ICD-10-CM | POA: Diagnosis not present

## 2016-09-09 DIAGNOSIS — G3184 Mild cognitive impairment, so stated: Secondary | ICD-10-CM | POA: Diagnosis not present

## 2016-09-09 DIAGNOSIS — Z7902 Long term (current) use of antithrombotics/antiplatelets: Secondary | ICD-10-CM | POA: Diagnosis not present

## 2016-09-09 DIAGNOSIS — R69 Illness, unspecified: Secondary | ICD-10-CM | POA: Diagnosis not present

## 2016-09-09 DIAGNOSIS — I509 Heart failure, unspecified: Secondary | ICD-10-CM | POA: Diagnosis not present

## 2016-09-09 DIAGNOSIS — I110 Hypertensive heart disease with heart failure: Secondary | ICD-10-CM | POA: Diagnosis not present

## 2016-09-09 DIAGNOSIS — I2510 Atherosclerotic heart disease of native coronary artery without angina pectoris: Secondary | ICD-10-CM | POA: Diagnosis not present

## 2016-09-09 DIAGNOSIS — Z0000 Encounter for general adult medical examination without abnormal findings: Secondary | ICD-10-CM | POA: Diagnosis not present

## 2016-09-09 DIAGNOSIS — G4089 Other seizures: Secondary | ICD-10-CM | POA: Diagnosis not present

## 2016-09-09 DIAGNOSIS — Z6827 Body mass index (BMI) 27.0-27.9, adult: Secondary | ICD-10-CM | POA: Diagnosis not present

## 2016-09-13 DIAGNOSIS — Z6826 Body mass index (BMI) 26.0-26.9, adult: Secondary | ICD-10-CM | POA: Diagnosis not present

## 2016-09-13 DIAGNOSIS — Z1389 Encounter for screening for other disorder: Secondary | ICD-10-CM | POA: Diagnosis not present

## 2016-09-13 DIAGNOSIS — R69 Illness, unspecified: Secondary | ICD-10-CM | POA: Diagnosis not present

## 2016-09-13 DIAGNOSIS — E663 Overweight: Secondary | ICD-10-CM | POA: Diagnosis not present

## 2016-09-13 DIAGNOSIS — H6091 Unspecified otitis externa, right ear: Secondary | ICD-10-CM | POA: Diagnosis not present

## 2016-09-22 ENCOUNTER — Inpatient Hospital Stay (HOSPITAL_COMMUNITY)
Admission: EM | Admit: 2016-09-22 | Discharge: 2016-10-24 | DRG: 064 | Disposition: E | Payer: Medicare HMO | Attending: Internal Medicine | Admitting: Internal Medicine

## 2016-09-22 ENCOUNTER — Emergency Department (HOSPITAL_COMMUNITY): Payer: Medicare HMO

## 2016-09-22 ENCOUNTER — Inpatient Hospital Stay (HOSPITAL_COMMUNITY): Payer: Medicare HMO

## 2016-09-22 DIAGNOSIS — R4020 Unspecified coma: Secondary | ICD-10-CM | POA: Diagnosis not present

## 2016-09-22 DIAGNOSIS — Z881 Allergy status to other antibiotic agents status: Secondary | ICD-10-CM

## 2016-09-22 DIAGNOSIS — R4182 Altered mental status, unspecified: Secondary | ICD-10-CM

## 2016-09-22 DIAGNOSIS — G9389 Other specified disorders of brain: Secondary | ICD-10-CM | POA: Diagnosis not present

## 2016-09-22 DIAGNOSIS — I619 Nontraumatic intracerebral hemorrhage, unspecified: Secondary | ICD-10-CM | POA: Diagnosis present

## 2016-09-22 DIAGNOSIS — J9690 Respiratory failure, unspecified, unspecified whether with hypoxia or hypercapnia: Secondary | ICD-10-CM | POA: Diagnosis not present

## 2016-09-22 DIAGNOSIS — Z8249 Family history of ischemic heart disease and other diseases of the circulatory system: Secondary | ICD-10-CM

## 2016-09-22 DIAGNOSIS — G8929 Other chronic pain: Secondary | ICD-10-CM | POA: Diagnosis present

## 2016-09-22 DIAGNOSIS — G936 Cerebral edema: Secondary | ICD-10-CM | POA: Diagnosis present

## 2016-09-22 DIAGNOSIS — Z66 Do not resuscitate: Secondary | ICD-10-CM | POA: Diagnosis not present

## 2016-09-22 DIAGNOSIS — Z7902 Long term (current) use of antithrombotics/antiplatelets: Secondary | ICD-10-CM | POA: Diagnosis not present

## 2016-09-22 DIAGNOSIS — Z809 Family history of malignant neoplasm, unspecified: Secondary | ICD-10-CM

## 2016-09-22 DIAGNOSIS — I612 Nontraumatic intracerebral hemorrhage in hemisphere, unspecified: Secondary | ICD-10-CM | POA: Diagnosis not present

## 2016-09-22 DIAGNOSIS — M545 Low back pain: Secondary | ICD-10-CM | POA: Diagnosis not present

## 2016-09-22 DIAGNOSIS — E119 Type 2 diabetes mellitus without complications: Secondary | ICD-10-CM | POA: Diagnosis present

## 2016-09-22 DIAGNOSIS — I2510 Atherosclerotic heart disease of native coronary artery without angina pectoris: Secondary | ICD-10-CM | POA: Diagnosis present

## 2016-09-22 DIAGNOSIS — J439 Emphysema, unspecified: Secondary | ICD-10-CM | POA: Diagnosis present

## 2016-09-22 DIAGNOSIS — K760 Fatty (change of) liver, not elsewhere classified: Secondary | ICD-10-CM | POA: Diagnosis present

## 2016-09-22 DIAGNOSIS — I615 Nontraumatic intracerebral hemorrhage, intraventricular: Principal | ICD-10-CM | POA: Diagnosis present

## 2016-09-22 DIAGNOSIS — Z86010 Personal history of colonic polyps: Secondary | ICD-10-CM | POA: Diagnosis not present

## 2016-09-22 DIAGNOSIS — Z515 Encounter for palliative care: Secondary | ICD-10-CM | POA: Diagnosis not present

## 2016-09-22 DIAGNOSIS — E7800 Pure hypercholesterolemia, unspecified: Secondary | ICD-10-CM | POA: Diagnosis present

## 2016-09-22 DIAGNOSIS — I610 Nontraumatic intracerebral hemorrhage in hemisphere, subcortical: Secondary | ICD-10-CM | POA: Diagnosis not present

## 2016-09-22 DIAGNOSIS — Z825 Family history of asthma and other chronic lower respiratory diseases: Secondary | ICD-10-CM | POA: Diagnosis not present

## 2016-09-22 DIAGNOSIS — Z823 Family history of stroke: Secondary | ICD-10-CM

## 2016-09-22 DIAGNOSIS — R569 Unspecified convulsions: Secondary | ICD-10-CM | POA: Diagnosis not present

## 2016-09-22 DIAGNOSIS — Z9842 Cataract extraction status, left eye: Secondary | ICD-10-CM | POA: Diagnosis not present

## 2016-09-22 DIAGNOSIS — Z9841 Cataract extraction status, right eye: Secondary | ICD-10-CM

## 2016-09-22 DIAGNOSIS — Z880 Allergy status to penicillin: Secondary | ICD-10-CM | POA: Diagnosis not present

## 2016-09-22 DIAGNOSIS — M1611 Unilateral primary osteoarthritis, right hip: Secondary | ICD-10-CM | POA: Diagnosis present

## 2016-09-22 DIAGNOSIS — R531 Weakness: Secondary | ICD-10-CM | POA: Diagnosis not present

## 2016-09-22 DIAGNOSIS — Z4682 Encounter for fitting and adjustment of non-vascular catheter: Secondary | ICD-10-CM | POA: Diagnosis not present

## 2016-09-22 DIAGNOSIS — Z452 Encounter for adjustment and management of vascular access device: Secondary | ICD-10-CM | POA: Diagnosis not present

## 2016-09-22 DIAGNOSIS — Z90710 Acquired absence of both cervix and uterus: Secondary | ICD-10-CM | POA: Diagnosis not present

## 2016-09-22 DIAGNOSIS — I629 Nontraumatic intracranial hemorrhage, unspecified: Secondary | ICD-10-CM | POA: Diagnosis not present

## 2016-09-22 DIAGNOSIS — F17210 Nicotine dependence, cigarettes, uncomplicated: Secondary | ICD-10-CM | POA: Diagnosis present

## 2016-09-22 DIAGNOSIS — R69 Illness, unspecified: Secondary | ICD-10-CM | POA: Diagnosis not present

## 2016-09-22 DIAGNOSIS — S062X0A Diffuse traumatic brain injury without loss of consciousness, initial encounter: Secondary | ICD-10-CM | POA: Diagnosis not present

## 2016-09-22 DIAGNOSIS — K5730 Diverticulosis of large intestine without perforation or abscess without bleeding: Secondary | ICD-10-CM | POA: Diagnosis present

## 2016-09-22 DIAGNOSIS — E876 Hypokalemia: Secondary | ICD-10-CM | POA: Diagnosis present

## 2016-09-22 DIAGNOSIS — I6789 Other cerebrovascular disease: Secondary | ICD-10-CM | POA: Diagnosis not present

## 2016-09-22 DIAGNOSIS — I10 Essential (primary) hypertension: Secondary | ICD-10-CM | POA: Diagnosis not present

## 2016-09-22 DIAGNOSIS — R404 Transient alteration of awareness: Secondary | ICD-10-CM | POA: Diagnosis not present

## 2016-09-22 DIAGNOSIS — Z961 Presence of intraocular lens: Secondary | ICD-10-CM | POA: Diagnosis present

## 2016-09-22 DIAGNOSIS — R001 Bradycardia, unspecified: Secondary | ICD-10-CM | POA: Diagnosis present

## 2016-09-22 LAB — DIFFERENTIAL
BASOS ABS: 0.1 10*3/uL (ref 0.0–0.1)
BASOS PCT: 1 %
EOS ABS: 0.5 10*3/uL (ref 0.0–0.7)
Eosinophils Relative: 5 %
Lymphocytes Relative: 31 %
Lymphs Abs: 2.9 10*3/uL (ref 0.7–4.0)
MONOS PCT: 10 %
Monocytes Absolute: 0.9 10*3/uL (ref 0.1–1.0)
NEUTROS ABS: 5 10*3/uL (ref 1.7–7.7)
Neutrophils Relative %: 53 %

## 2016-09-22 LAB — URINALYSIS, ROUTINE W REFLEX MICROSCOPIC
Bilirubin Urine: NEGATIVE
Glucose, UA: NEGATIVE mg/dL
Hgb urine dipstick: NEGATIVE
Ketones, ur: NEGATIVE mg/dL
Leukocytes, UA: NEGATIVE
Nitrite: NEGATIVE
PROTEIN: 30 mg/dL — AB
SPECIFIC GRAVITY, URINE: 1.004 — AB (ref 1.005–1.030)
SQUAMOUS EPITHELIAL / LPF: NONE SEEN
pH: 8 (ref 5.0–8.0)

## 2016-09-22 LAB — COMPREHENSIVE METABOLIC PANEL
ALT: 24 U/L (ref 14–54)
AST: 24 U/L (ref 15–41)
Albumin: 4.3 g/dL (ref 3.5–5.0)
Alkaline Phosphatase: 65 U/L (ref 38–126)
Anion gap: 9 (ref 5–15)
BUN: 9 mg/dL (ref 6–20)
CHLORIDE: 97 mmol/L — AB (ref 101–111)
CO2: 29 mmol/L (ref 22–32)
Calcium: 9.7 mg/dL (ref 8.9–10.3)
Creatinine, Ser: 0.69 mg/dL (ref 0.44–1.00)
Glucose, Bld: 114 mg/dL — ABNORMAL HIGH (ref 65–99)
Potassium: 3.5 mmol/L (ref 3.5–5.1)
SODIUM: 135 mmol/L (ref 135–145)
Total Bilirubin: 0.5 mg/dL (ref 0.3–1.2)
Total Protein: 7.7 g/dL (ref 6.5–8.1)

## 2016-09-22 LAB — I-STAT TROPONIN, ED: TROPONIN I, POC: 0 ng/mL (ref 0.00–0.08)

## 2016-09-22 LAB — I-STAT CHEM 8, ED
BUN: 7 mg/dL (ref 6–20)
Calcium, Ion: 1.09 mmol/L — ABNORMAL LOW (ref 1.15–1.40)
Chloride: 101 mmol/L (ref 101–111)
Creatinine, Ser: 0.7 mg/dL (ref 0.44–1.00)
GLUCOSE: 114 mg/dL — AB (ref 65–99)
HCT: 42 % (ref 36.0–46.0)
HEMOGLOBIN: 14.3 g/dL (ref 12.0–15.0)
POTASSIUM: 3.5 mmol/L (ref 3.5–5.1)
Sodium: 136 mmol/L (ref 135–145)
TCO2: 31 mmol/L (ref 0–100)

## 2016-09-22 LAB — CBC
HEMATOCRIT: 42.9 % (ref 36.0–46.0)
Hemoglobin: 14.8 g/dL (ref 12.0–15.0)
MCH: 30.7 pg (ref 26.0–34.0)
MCHC: 34.5 g/dL (ref 30.0–36.0)
MCV: 89 fL (ref 78.0–100.0)
PLATELETS: 360 10*3/uL (ref 150–400)
RBC: 4.82 MIL/uL (ref 3.87–5.11)
RDW: 12.7 % (ref 11.5–15.5)
WBC: 9.3 10*3/uL (ref 4.0–10.5)

## 2016-09-22 LAB — BLOOD GAS, ARTERIAL
Acid-Base Excess: 4.2 mmol/L — ABNORMAL HIGH (ref 0.0–2.0)
BICARBONATE: 28 mmol/L (ref 20.0–28.0)
Drawn by: 105551
FIO2: 100
LHR: 16 {breaths}/min
O2 SAT: 99.7 %
PCO2 ART: 42.5 mmHg (ref 32.0–48.0)
PEEP/CPAP: 5 cmH2O
PH ART: 7.437 (ref 7.350–7.450)
VT: 450 mL
pO2, Arterial: 411 mmHg — ABNORMAL HIGH (ref 83.0–108.0)

## 2016-09-22 LAB — PROTIME-INR
INR: 1
Prothrombin Time: 13.2 seconds (ref 11.4–15.2)

## 2016-09-22 LAB — RAPID URINE DRUG SCREEN, HOSP PERFORMED
AMPHETAMINES: NOT DETECTED
BENZODIAZEPINES: NOT DETECTED
Barbiturates: NOT DETECTED
Cocaine: NOT DETECTED
Opiates: NOT DETECTED
Tetrahydrocannabinol: NOT DETECTED

## 2016-09-22 LAB — ETHANOL: Alcohol, Ethyl (B): <5 mg/dL (ref <5)

## 2016-09-22 LAB — APTT: APTT: 32 s (ref 24–36)

## 2016-09-22 MED ORDER — NICARDIPINE HCL IN NACL 20-0.86 MG/200ML-% IV SOLN
0.0000 mg/h | INTRAVENOUS | Status: DC
  Administered 2016-09-22: 5 mg/h via INTRAVENOUS
  Administered 2016-09-22: 12 mg/h via INTRAVENOUS
  Filled 2016-09-22 (×3): qty 200

## 2016-09-22 MED ORDER — PROPOFOL 1000 MG/100ML IV EMUL
INTRAVENOUS | Status: AC
  Administered 2016-09-22: 5 ug/kg/min via INTRAVENOUS
  Filled 2016-09-22: qty 100

## 2016-09-22 MED ORDER — SODIUM CHLORIDE 3 % IV SOLN
INTRAVENOUS | Status: DC
  Administered 2016-09-23: 50 mL/h via INTRAVENOUS
  Filled 2016-09-22 (×2): qty 500

## 2016-09-22 MED ORDER — SODIUM CHLORIDE 0.9 % IV SOLN: 250.0000 mL | INTRAVENOUS | Status: DC | PRN

## 2016-09-22 MED ORDER — PROPOFOL 1000 MG/100ML IV EMUL: 0.0000 ug/kg/min | INTRAVENOUS | Status: DC

## 2016-09-22 MED ORDER — DOCUSATE SODIUM 50 MG/5ML PO LIQD: 100.0000 mg | Freq: Two times a day (BID) | ORAL | Status: DC | PRN

## 2016-09-22 MED ORDER — PROPOFOL 1000 MG/100ML IV EMUL
5.0000 ug/kg/min | Freq: Once | INTRAVENOUS | Status: AC
  Administered 2016-09-22: 5 ug/kg/min via INTRAVENOUS

## 2016-09-22 MED ORDER — SODIUM CHLORIDE 0.9 % IV SOLN: INTRAVENOUS | Status: DC

## 2016-09-22 MED ORDER — ETOMIDATE 2 MG/ML IV SOLN
20.0000 mg | Freq: Once | INTRAVENOUS | Status: AC
  Administered 2016-09-22: 20 mg via INTRAVENOUS

## 2016-09-22 MED ORDER — ROCURONIUM BROMIDE 50 MG/5ML IV SOLN
1.0000 mg/kg | Freq: Once | INTRAVENOUS | Status: DC
  Administered 2016-09-22: 80 mg via INTRAVENOUS

## 2016-09-22 MED ORDER — DONEPEZIL HCL 10 MG PO TABS: 5.0000 mg | ORAL_TABLET | Freq: Every day | ORAL | Status: DC

## 2016-09-22 MED ORDER — LEVETIRACETAM 500 MG/5ML IV SOLN
1000.0000 mg | Freq: Two times a day (BID) | INTRAVENOUS | Status: DC
  Administered 2016-09-23: 1000 mg via INTRAVENOUS
  Filled 2016-09-22 (×2): qty 10

## 2016-09-22 MED ORDER — BISACODYL 10 MG RE SUPP: 10.0000 mg | Freq: Every day | RECTAL | Status: DC | PRN

## 2016-09-22 MED ORDER — SODIUM CHLORIDE 0.9 % IV SOLN
1000.0000 mL | Freq: Once | INTRAVENOUS | Status: AC
  Administered 2016-09-22: 1000 mL via INTRAVENOUS

## 2016-09-22 NOTE — ED Notes (Signed)
Pt cleared and taken to cT

## 2016-09-22 NOTE — ED Notes (Signed)
Pt given a 250 bolus of NS.

## 2016-09-22 NOTE — ED Notes (Signed)
cbg 111 in route
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
## 2016-09-22 NOTE — ED Triage Notes (Signed)
Sitting on porch with family.pt got up to use bathroom and c/o right arm and leg numbness. Pt able to walk to bathroom  Per family. Per family heard pt fall and found pt alert on floor on side. Family states the more they talked to her the more she had slurred speech. This happened at 1730

## 2016-09-22 NOTE — Progress Notes (Signed)
RT NOTE:  Pt transported to CT and back without event.  

## 2016-09-22 NOTE — ED Notes (Signed)
[facility] removed from monitor and placed on their equipment.
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
## 2016-09-22 NOTE — ED Notes (Signed)
2nd bolus of NS started.

## 2016-09-22 NOTE — ED Notes (Signed)
edp with pt 

## 2016-09-23 DIAGNOSIS — Z515 Encounter for palliative care: Secondary | ICD-10-CM

## 2016-09-23 DIAGNOSIS — Z66 Do not resuscitate: Secondary | ICD-10-CM

## 2016-09-23 DIAGNOSIS — I612 Nontraumatic intracerebral hemorrhage in hemisphere, unspecified: Secondary | ICD-10-CM

## 2016-09-23 DIAGNOSIS — R4020 Unspecified coma: Secondary | ICD-10-CM

## 2016-09-23 LAB — BASIC METABOLIC PANEL
Anion gap: 13 (ref 5–15)
BUN: 6 mg/dL (ref 6–20)
CALCIUM: 9.2 mg/dL (ref 8.9–10.3)
CO2: 28 mmol/L (ref 22–32)
CREATININE: 0.75 mg/dL (ref 0.44–1.00)
Chloride: 99 mmol/L — ABNORMAL LOW (ref 101–111)
GFR calc Af Amer: >60 mL/min (ref >60)
GFR calc non Af Amer: >60 mL/min (ref >60)
GLUCOSE: 150 mg/dL — AB (ref 65–99)
Potassium: 3.2 mmol/L — ABNORMAL LOW (ref 3.5–5.1)
Sodium: 140 mmol/L (ref 135–145)

## 2016-09-23 LAB — TYPE AND SCREEN
ABO/RH(D): O POS
ANTIBODY SCREEN: NEGATIVE

## 2016-09-23 LAB — TRIGLYCERIDES: Triglycerides: 150 mg/dL — ABNORMAL HIGH (ref <150)

## 2016-09-23 LAB — PROTIME-INR
INR: 1.08
Prothrombin Time: 14.1 seconds (ref 11.4–15.2)

## 2016-09-23 LAB — PHOSPHORUS: Phosphorus: 3.7 mg/dL (ref 2.5–4.6)

## 2016-09-23 LAB — MRSA PCR SCREENING: MRSA BY PCR: NEGATIVE

## 2016-09-23 LAB — MAGNESIUM: Magnesium: 1.9 mg/dL (ref 1.7–2.4)

## 2016-09-23 LAB — ABO/RH: ABO/RH(D): O POS

## 2016-09-23 LAB — APTT: APTT: 32 s (ref 24–36)

## 2016-09-23 MED ORDER — MORPHINE SULFATE 25 MG/ML IV SOLN
1.0000 mg/h | INTRAVENOUS | Status: DC
  Administered 2016-09-23: 3 mg/h via INTRAVENOUS
  Filled 2016-09-23 (×2): qty 10

## 2016-09-23 MED ORDER — SODIUM CHLORIDE 0.9 % IV SOLN
10.0000 mg/h | INTRAVENOUS | Status: DC
  Filled 2016-09-23 (×2): qty 10

## 2016-09-23 MED ORDER — MORPHINE BOLUS VIA INFUSION
5.0000 mg | INTRAVENOUS | Status: DC | PRN
  Filled 2016-09-23: qty 20

## 2016-09-23 MED ORDER — SODIUM CHLORIDE 0.9 % IV SOLN
30.0000 meq | Freq: Once | INTRAVENOUS | Status: DC
  Filled 2016-09-23: qty 15

## 2016-09-23 MED ORDER — CHLORHEXIDINE GLUCONATE 0.12% ORAL RINSE (MEDLINE KIT)
15.0000 mL | Freq: Two times a day (BID) | OROMUCOSAL | Status: DC
  Administered 2016-09-23: 15 mL via OROMUCOSAL

## 2016-09-23 MED ORDER — ORAL CARE MOUTH RINSE
15.0000 mL | Freq: Two times a day (BID) | OROMUCOSAL | Status: DC
  Administered 2016-09-23: 15 mL via OROMUCOSAL

## 2016-09-23 MED ORDER — ORAL CARE MOUTH RINSE
15.0000 mL | OROMUCOSAL | Status: DC
  Administered 2016-09-23: 15 mL via OROMUCOSAL

## 2016-09-23 MED ORDER — CHLORHEXIDINE GLUCONATE 0.12 % MT SOLN
15.0000 mL | Freq: Two times a day (BID) | OROMUCOSAL | Status: DC
  Administered 2016-09-23: 15 mL via OROMUCOSAL

## 2016-09-23 MED ORDER — SODIUM CHLORIDE 0.9 % IV SOLN
30.0000 meq | Freq: Once | INTRAVENOUS | Status: AC
  Administered 2016-09-23: 30 meq via INTRAVENOUS
  Filled 2016-09-23: qty 15

## 2016-09-23 NOTE — Progress Notes (Signed)
PULMONARY / CRITICAL CARE MEDICINE   Name: Shannon Maldonado MRN: [id number] DOB: [birth year]    ADMISSION DATE:  09/04/2016  CHIEF COMPLAINT:  Loss of consciousness  HISTORY OF PRESENT ILLNESS:   Shannon Maldonado is a 81 y/o woman with HTN, prior neurologic described as episodes of acute memory loss which were believed to be possible seizure activity (treated successfully with Keppra). She developed sudden LOC and was taken to [facility] ED where she was intubated and a head CT showed acute ICH. She was transferred to [facility].     VITAL SIGNS: BP (!) 203/85   Pulse 77   Temp (!) 100.7 F (38.2 C)   Resp (!) 22   Ht 5\' 6"  (1.676 m)   Wt 68.5 kg (151 lb 0.2 oz)   SpO2 95%   BMI 24.37 kg/m   HEMODYNAMICS:    VENTILATOR SETTINGS: Vent Mode: PRVC FiO2 (%):  [40 %-100 %] 40 % Set Rate:  [16 bmp] 16 bmp Vt Set:  [450 mL] 450 mL PEEP:  [5 cmH20] 5 cmH20 Plateau Pressure:  [12 cmH20-17 cmH20] 12 cmH20  INTAKE / OUTPUT: I/O last 3 completed shifts: In: 731.3 [I.V.:466.3; IV Piggyback:265] Out: 450 [Urine:450]  PHYSICAL EXAMINATION: General:  No responsive action Neuro:  No meaningful response HEENT:  MMM Cardiovascular:  +2 pulses Lungs:  CTA Abdomen:  Soft Musculoskeletal:   Skin:  No rashes on visible skin  LABS:  BMET  Recent Labs Lab 08/26/2016 1834 09/17/2016 1839 09/16/2016 2349  NA 135 136 140  K 3.5 3.5 3.2*  CL 97* 101 99*  CO2 29  --  28  BUN 9 7 6   CREATININE 0.69 0.70 0.75  GLUCOSE 114* 114* 150*    Electrolytes  Recent Labs Lab 09/03/2016 1834 09/07/2016 2349  CALCIUM 9.7 9.2  MG  --  1.9  PHOS  --  3.7    CBC  Recent Labs Lab 09/16/2016 1834 09/02/2016 1839  WBC 9.3  --   HGB 14.8 14.3  HCT 42.9 42.0  PLT 360  --     Coag's  Recent Labs Lab 09/20/2016 1834 09/09/2016 2349  APTT 32 32  INR 1.00 1.08    Sepsis Markers No results for input(s): LATICACIDVEN, PROCALCITON, O2SATVEN in the last 168 hours.  ABG  Recent Labs Lab  09/14/2016 1930  PHART 7.437  PCO2ART 42.5  PO2ART 411.0*    Liver Enzymes  Recent Labs Lab 09/07/2016 1834  AST 24  ALT 24  ALKPHOS 65  BILITOT 0.5  ALBUMIN 4.3    Cardiac Enzymes No results for input(s): TROPONINI, PROBNP in the last 168 hours.  Glucose No results for input(s): GLUCAP in the last 168 hours.  Imaging Ct Head Wo Contrast  Result Date: 09/02/2016 CLINICAL DATA:  Follow-up known intracranial hemorrhage. Subsequent encounter. EXAM: CT HEAD WITHOUT CONTRAST TECHNIQUE: Contiguous axial images were obtained from the base of the skull through the vertex without intravenous contrast. COMPARISON:  CT of the head performed earlier today at 4:23 p.m. FINDINGS: Brain: The patient's large acute left parietal intracranial hemorrhage has increased in size, now measuring 7.6 x 7.1 cm, with nearly 1 cm of rightward midline shift. Blood tracks adjacent to the pons, with associated edema and mass effect. There is suggestion of mild transtentorial herniation. A large amount of blood is seen filling the lateral ventricles and third ventricle, and extending to the fourth ventricle, increased from the prior study. There is dilatation of the lateral ventricles, reflecting underlying hydrocephalus.  Diffuse periventricular and subcortical white matter change likely reflects small vessel ischemic microangiopathy. Underlying mild to moderate cortical volume loss is noted. Vascular: No hyperdense vessel or unexpected calcification. Skull: There is no evidence of fracture; visualized osseous structures are unremarkable in appearance. Sinuses/Orbits: The visualized portions of the orbits are within normal limits. The paranasal sinuses and mastoid air cells are well-aerated. Other: No significant soft tissue abnormalities are seen. IMPRESSION: 1. Large acute left parietal intracranial hemorrhage has increased in size, now measuring 7.6 x 7.1 cm, with nearly 1 cm rightward midline shift. Blood tracks  adjacent to the pons, with associated edema and mass effect. Suggestion of mild transtentorial herniation. 2. Large amount of blood filling the ventricles, increased from the prior study. Dilatation of the lateral ventricles reflects underlying hydrocephalus, new from the prior study. 3. Diffuse small vessel ischemic microangiopathy and mild to moderate cortical volume loss. Critical Value/emergent results were called by telephone at the time of interpretation on 08/30/2016 at 11:40 pm to Nursing on [id number], who verbally acknowledged these results. Electronically Signed   By: Garald Balding M.D.   On: 08/31/2016 23:46   Dg Chest Port 1 View  Result Date: 09/21/2016 CLINICAL DATA:  Respiratory failure. History of diabetes, hypertension, smoker. EXAM: PORTABLE CHEST 1 VIEW COMPARISON:  Chest radiograph September 22, 2016 at 1900 hours. FINDINGS: Endotracheal tube tip projects 17 mm above the carina. Nasogastric tube looped in proximal stomach with distal tip projecting at back at GE junction. Cardiomediastinal silhouette is normal, mildly calcified aortic knob. Increased lung volumes, bibasilar strandy densities. No pleural effusion or focal consolidation. No pneumothorax. Soft tissue planes and included osseous structures are unchanged. IMPRESSION: Similarly positioned life-support lines. COPD and bibasilar atelectasis/ scarring. Electronically Signed   By: Elon Alas M.D.   On: 09/17/2016 22:42   Dg Chest Portable 1 View  Result Date: 09/02/2016 CLINICAL DATA:  Status post intubation. EXAM: PORTABLE CHEST 1 VIEW COMPARISON:  None FINDINGS: There is an ET tube with tip above the carina. The nasogastric tube is looped within the stomach. Normal heart size. No pleural effusion or edema. No airspace opacities. IMPRESSION: Satisfactory position of the ET tube which is well situated above the carina. Electronically Signed   By: Kerby Moors M.D.   On: 09/23/2016 19:34   Ct Head Code Stroke W/o Cm  Result  Date: 09/19/2016 CLINICAL DATA:  Code stroke. Unwitnessed fall. Found down in bathroom. On Plavix. Aphasia. EXAM: CT HEAD WITHOUT CONTRAST TECHNIQUE: Contiguous axial images were obtained from the base of the skull through the vertex without intravenous contrast. COMPARISON:  06/23/2016 FINDINGS: Brain: There is an acute parenchymal hemorrhage involving the left thalamus and extending laterally into the adjacent deep white matter as well as inferiorly into the left midbrain. The hematoma measures 4.5 x 3.0 x 2.7 cm (estimated volume 18 cc). There is intraventricular extension with small volume blood in the left greater than right lateral ventricles. No significant ventricular dilatation is seen compared to the prior study to indicate acute hydrocephalus. Local rightward midline shift at the level of the thalami measures 7 mm. There is mild edema surrounding the parenchymal hematoma. Bilateral cerebral white matter hypodensities are nonspecific but compatible with moderate chronic small vessel ischemic disease. There is mild cerebral atrophy. There is no evidence of acute cortically based infarct or extra-axial fluid collection. Vascular: Mild calcified atherosclerosis at the skullbase. No hyperdense vessel. Skull: No fracture or suspicious osseous lesion. Sinuses/Orbits: Mild bilateral ethmoid air cell mucosal thickening. Clear mastoid  air cells. Bilateral cataract extraction. Other: None. ASPECTS [facility] Stroke Program Early CT Score): Not scored due to hemorrhage. IMPRESSION: 1. 4.5 cm acute left thalamic hemorrhage extending into the midbrain and lateral ventricles. 2. 7 mm rightward midline shift. 3. Moderate chronic small vessel ischemic disease. Critical Value/emergent results were called by telephone at the time of interpretation on 09/20/2016 at 6:39 pm to Dr. Dayna Barker, who verbally acknowledged these results. Electronically Signed   By: Logan Bores M.D.   On: 08/27/2016 18:40     STUDIES:  Head CT  (initial): large ICH on L thalamus.  CULTURES: none  ANTIBIOTICS: none  SIGNIFICANT EVENTS: Intubated in [facility]  LINES/TUBES: PIVs ETT out 3/31 per family request. OGT Foley  DISCUSSION: 81 y/o woman with ICH  ASSESSMENT / PLAN:  PULMONARY A: Need for mechanical ventilation Emphysema seen on CXR P:   Extubated one way 3/31  NEUROLOGIC A:   Severe ICH P:   Full comfort care, tx to floor MSO4 drip   FAMILY  - Updates: Family at bedside, full comfort care. Tx to regular floor.  - Inter-disciplinary family meet or Palliative Care meeting due by:  09/29/16  Richardson Landry Minor ACNP Maryanna Shape PCCM Pager [phone] till 3 pm If no answer page [phone] 09/23/2016, 9:32 AM

## 2016-09-23 NOTE — Progress Notes (Signed)
STROKE TEAM PROGRESS NOTE   HISTORY OF PRESENT ILLNESS (per record) Shannon Maldonado is an 81 y.o. female who presents with large left thalamic hemorrhage with intranventricular extension and mass effect with left to right midline shift. Symptom onset was at home at 1730, when she told a relative that it felt like her right arm and leg were numb and falling asleep. She did not have a headache at that time. She went to the bathroom and then family member heard her fall. She was found on her side, conscious and able to communicate. Speech then became slurred and rapidly worsened. She was still able to communicate with family to an extent when EMS arrived but continued to decline. At OSH, she began exhibiting sonorous respirations, necessitating intubation and sedation. Initially a Code Stroke was called. CT head showed the large left thalamic hemorrhage with associated findings described above. She is not on anticoagulation or an antiplatelet medication at home, per family, but Plavix is on her list of outpatient medications in [facility]. She was started on nicardipine drip and transported to [facility]. On arrival to [facility] ICU she was intubated and unconscious with light propofol sedation.   On arrival, Neurosurgery was contacted for possible ventriculostomy placement. STAT repeat CT head was obtained to determine stability vs progression of the hemorrhage. Repeat CT showed significant interval enlargement of the hematoma with worsened midline shift, increased ventricular size and further intraventricular extension of hemorrhage. Images from second CT were reviewed with Neurosurgery and it was felt that intervention would be futile.    Her PMHx includes DM, HTN, hypercholesterolemia, seizures and smoking.    DNR - Comfort Care Only   SUBJECTIVE (INTERVAL HISTORY) Two family members at the bedside. Dr. Leonie Man discussed the poor prognosis and Comfort Care. The decision had already been made. Dr. Leonie Man offered to  review the images with the family; however, another physician had already done so.   OBJECTIVE Temp:  [97 F (36.1 C)-101 F (38.3 C)] 100.7 F (38.2 C) (03/31 0700) Pulse Rate:  [44-107] 77 (03/31 0700) Resp:  [13-25] 22 (03/31 0700) BP: (75-257)/(37-105) 203/85 (03/31 0345) SpO2:  [91 %-100 %] 95 % (03/31 0700) FiO2 (%):  [40 %-100 %] 40 % (03/31 0006) Weight:  [68.5 kg (151 lb 0.2 oz)-72.6 kg (160 lb)] 68.5 kg (151 lb 0.2 oz) (03/31 0208)  CBC:   Recent Labs Lab 09/19/2016 1834 09/06/2016 1839  WBC 9.3  --   NEUTROABS 5.0  --   HGB 14.8 14.3  HCT 42.9 42.0  MCV 89.0  --   PLT 360  --     Basic Metabolic Panel:   Recent Labs Lab 09/21/2016 1834 09/11/2016 1839 09/06/2016 2349  NA 135 136 140  K 3.5 3.5 3.2*  CL 97* 101 99*  CO2 29  --  28  GLUCOSE 114* 114* 150*  BUN 9 7 6   CREATININE 0.69 0.70 0.75  CALCIUM 9.7  --  9.2  MG  --   --  1.9  PHOS  --   --  3.7    Lipid Panel:     Component Value Date/Time   CHOL 104 11/28/2012 0500   TRIG 150 (H) 09/14/2016 2349   HDL 27 (L) 11/28/2012 0500   CHOLHDL 3.9 11/28/2012 0500   VLDL 28 11/28/2012 0500   LDLCALC 49 11/28/2012 0500   HgbA1c:  Lab Results  Component Value Date   HGBA1C 5.8 (H) 06/23/2016   Urine Drug Screen:     Component  Value Date/Time   LABOPIA NONE DETECTED 09/08/2016 1850   COCAINSCRNUR NONE DETECTED 09/18/2016 1850   LABBENZ NONE DETECTED 09/02/2016 1850   AMPHETMU NONE DETECTED 09/23/2016 1850   THCU NONE DETECTED 09/14/2016 1850   LABBARB NONE DETECTED 08/26/2016 1850      IMAGING  Ct Head Wo Contrast 09/05/2016 1. Large acute left parietal intracranial hemorrhage has increased in size, now measuring 7.6 x 7.1 cm, with nearly 1 cm rightward midline shift. Blood tracks adjacent to the pons, with associated edema and mass effect. Suggestion of mild transtentorial herniation.  2. Large amount of blood filling the ventricles, increased from the prior study. Dilatation of the lateral  ventricles reflects underlying hydrocephalus, new from the prior study.  3. Diffuse small vessel ischemic microangiopathy and mild to moderate cortical volume loss.      Dg Chest Port 1 View 08/29/2016  Similarly positioned life-support lines. COPD and bibasilar atelectasis/ scarring.     Dg Chest Portable 1 View 09/23/2016 Satisfactory position of the ET tube which is well situated above the carina.     Ct Head Code Stroke W/o Cm 09/14/2016 1. 4.5 cm acute left thalamic hemorrhage extending into the midbrain and lateral ventricles.  2. 7 mm rightward midline shift.  3. Moderate chronic small vessel ischemic disease.      PHYSICAL EXAM Frail elderly lady in mild respiratory distress. She is on morphine drip. . Afebrile. Head is nontraumatic. Neck is supple without bruit.    Cardiac exam no murmur or gallop. Lungs are clear to auscultation. Distal pulses are well felt. Neurological Exam Comatose and unresponsive. Eyes are closed. Does not answer to name Shannon Maldonado is to sternal rub. Left gaze preference. Pupils 3 mm and reactive. Corneal reflex is sluggish. Dolls eye movement or absent. She has slight withdrawal in the left upper and lower extremity to painful stimuli but no movements on the right. Both plantars are upgoing. ASSESSMENT/PLAN - DNR - Comfort Care Only Shannon Maldonado is a 81 y.o. female with history of diabetes mellitus, seizures, tobacco use, hypertension, hyperlipidemia, depression, and coronary artery disease presenting with right-sided numbness and weakness resulting in a fall. She did not receive IV t-PA due to hemorrhage.  Hemorrhagic stroke: large left thalamic hemorrhage with intranventricular extension and right midline shift.  Resultant  comatose unresponsive state  MRI - not performed  MRA - not performed.  Carotid Doppler - not indicated  2D Echo - not indicated  LDL - not indicated  HgbA1c - not indicated  VTE prophylaxis - comfort care  only Diet NPO time specified Diet NPO time specified  clopidogrel 75 mg daily prior to admission, now on No antithrombotic  Therapy recommendations: not indicated  Disposition: transfer to [place]   Other Stroke Risk Factors  Advanced age  Cigarette smoker prior to admission  Coronary artery disease   Other Active Problems  Hypokalemia - 3.2  DNR - [facility] day # 1  I have personally examined this patient, reviewed notes, independently viewed imaging studies, participated in medical decision making and plan of care.ROS completed by me personally and pertinent positives fully documented  I have made any additions or clarifications directly to the above note. I had a long discussion with the patient's family members at the bedside and explained the poor prognosis and imaging findings and answered questions. Family appeared to be understanding and are comfortable with the decision of DO NOT RESUSCITATE and comfort care. This patient is critically  ill and at significant risk of neurological worsening, death and care requires constant monitoring of vital signs, hemodynamics,respiratory and cardiac monitoring, extensive review of multiple databases, frequent neurological assessment, discussion with family, other specialists and medical decision making of high complexity.I have made any additions or clarifications directly to the above note.This critical care time does not reflect procedure time, or teaching time or supervisory time of PA/NP/Med Resident etc but could involve care discussion time.  I spent 30 minutes of neurocritical care time  in the care of  this patient.      Antony Contras, MD Medical Director [facility] Stroke Center Pager: [phone] 09/23/2016 3:34 PM   To contact Stroke Continuity provider, please refer to [email or web address] After hours, contact General Neurology

## 2016-09-23 NOTE — Progress Notes (Signed)
eLink Physician-Brief Progress Note Patient Name: Shannon Maldonado DOB: 1936/05/23 MRN: [id number]   Date of Service  09/23/2016  HPI/Events of Note  Patient with non-survivable ICH.  Family and bedside and discussed EOL issues with neurology.  Family in agreement to remove ETT and institute comfort measures.  eICU Interventions  Plan: Comfort measures Morphine gtt Patient to be extubated     Intervention Category Major Interventions: End of life / care limitation discussion  [place] 09/23/2016, 3:30 AM

## 2016-09-23 NOTE — Progress Notes (Signed)
CODE STROKE 09/15/2016 18:11 call 1810 Beeper 1823 exam started 5146 exam finished 0479 images sent to [place] exam completed in epic, [facility] called spoke with Zar.

## 2016-09-23 NOTE — H&P (Signed)
PULMONARY / CRITICAL CARE MEDICINE   Name: MARLEENA SHUBERT MRN: [id number] DOB: 07-10-1935    ADMISSION DATE:  09/18/2016  CHIEF COMPLAINT:  Loss of consciousness  HISTORY OF PRESENT ILLNESS:   Ms. Fifer is a 81 y/o woman with HTN, prior neurologic described as episodes of acute memory loss which were believed to be possible seizure activity (treated successfully with Keppra). She developed sudden LOC and was taken to [facility] ED where she was intubated and a head CT showed acute ICH. She was transferred to [facility].  PAST MEDICAL HISTORY :  She  has a past medical history of Arthritis; Chronic lower back pain; Coronary artery disease; Depression; Diverticula of colon; Exertional shortness of breath; Fatty liver; Follow-up examination of abnormal mammogram (08/01/2016); H/O Clostridium difficile infection (2005); High cholesterol; Hypertension; Respiratory arrest ([place]) (11/25/2012); Seizures ([place]); Syncope and collapse (11/25/2012); Tubulovillous adenoma (07/13/2003); and Type II diabetes mellitus ([place]).  PAST SURGICAL HISTORY: She  has a past surgical history that includes Colonoscopy (07/23/2003); Colonoscopy (09/26/2006); Colonoscopy (01/31/2012); ORIF hip fracture (Right, 04/1989); Cataract extraction w/ intraocular lens  implant, bilateral (Bilateral, 2000's); Abdominal hysterectomy (1980); Appendectomy (1980); and Cardiac catheterization.  Allergies  Allergen Reactions  . Penicillins Swelling and Other (See Comments)    Reaction:  Unspecified swelling reaction  Has patient had a PCN reaction causing immediate rash, facial/tongue/throat swelling, SOB or lightheadedness with hypotension: Yes Has patient had a PCN reaction causing severe rash involving mucus membranes or skin necrosis: No Has patient had a PCN reaction that required hospitalization No Has patient had a PCN reaction occurring within the last 10 years: No If all of the above answers are "NO", then may proceed with Cephalosporin use.   Marland Kitchen Amoxicillin Swelling and Other (See Comments)    Reaction:  Unspecified swelling reaction  Has patient had a PCN reaction causing immediate rash, facial/tongue/throat swelling, SOB or lightheadedness with hypotension: Yes Has patient had a PCN reaction causing severe rash involving mucus membranes or skin necrosis: No Has patient had a PCN reaction that required hospitalization No Has patient had a PCN reaction occurring within the last 10 years: No If all of the above answers are "NO", then may proceed with Cephalosporin use.    No current facility-administered medications on file prior to encounter.    Current Outpatient Prescriptions on File Prior to Encounter  Medication Sig  . calcium-vitamin D (OSCAL WITH D) 500-200 MG-UNIT tablet Take 1 tablet by mouth daily with breakfast.  . clopidogrel (PLAVIX) 75 MG tablet Take 1 tablet (75 mg total) by mouth daily with breakfast.  . levETIRAcetam (KEPPRA) 750 MG tablet Take 1 tablet (750 mg total) by mouth 2 (two) times daily.  Marland Kitchen losartan (COZAAR) 100 MG tablet Take 100 mg by mouth daily.  . metoprolol tartrate (LOPRESSOR) 25 MG tablet Take 25 mg by mouth 2 (two) times daily.  . Multiple Vitamin (MULTIVITAMIN WITH MINERALS) TABS Take 1 tablet by mouth daily.  . simvastatin (ZOCOR) 40 MG tablet Take 40 mg by mouth daily.     FAMILY HISTORY:  Her indicated that her mother is deceased. She indicated that her father is deceased. She indicated that the status of her brother is unknown.    SOCIAL HISTORY: She  reports that she has been smoking Cigarettes.  She has a 126.00 pack-year smoking history. She has never used smokeless tobacco. She reports that she does not drink alcohol or use drugs.  REVIEW OF SYSTEMS:   Unable   VITAL SIGNS: BP Marland Kitchen)  163/64   Pulse 81   Temp (!) 100.4 F (38 C)   Resp 16   Ht 5\' 6"  (1.676 m)   Wt 151 lb 0.2 oz (68.5 kg)   SpO2 100%   BMI 24.37 kg/m   HEMODYNAMICS:    VENTILATOR SETTINGS: Vent Mode:  PRVC FiO2 (%):  [40 %-100 %] 40 % Set Rate:  [16 bmp] 16 bmp Vt Set:  [450 mL] 450 mL PEEP:  [5 cmH20] 5 cmH20 Plateau Pressure:  [12 cmH20-17 cmH20] 12 cmH20  INTAKE / OUTPUT: No intake/output data recorded.  PHYSICAL EXAMINATION: General:  No responsive action Neuro:  No meaningful response HEENT:  MMM Cardiovascular:  +2 pulses Lungs:  CTA Abdomen:  Soft Musculoskeletal:   Skin:  No rashes on visible skin  LABS:  BMET  Recent Labs Lab 08/26/2016 1834 09/01/2016 1839 09/16/2016 2349  NA 135 136 140  K 3.5 3.5 3.2*  CL 97* 101 99*  CO2 29  --  28  BUN 9 7 6   CREATININE 0.69 0.70 0.75  GLUCOSE 114* 114* 150*    Electrolytes  Recent Labs Lab 09/16/2016 1834 09/07/2016 2349  CALCIUM 9.7 9.2  MG  --  1.9  PHOS  --  3.7    CBC  Recent Labs Lab 08/30/2016 1834 09/17/2016 1839  WBC 9.3  --   HGB 14.8 14.3  HCT 42.9 42.0  PLT 360  --     Coag's  Recent Labs Lab 08/25/2016 1834 09/18/2016 2349  APTT 32 32  INR 1.00 1.08    Sepsis Markers No results for input(s): LATICACIDVEN, PROCALCITON, O2SATVEN in the last 168 hours.  ABG  Recent Labs Lab 09/19/2016 1930  PHART 7.437  PCO2ART 42.5  PO2ART 411.0*    Liver Enzymes  Recent Labs Lab 09/20/2016 1834  AST 24  ALT 24  ALKPHOS 65  BILITOT 0.5  ALBUMIN 4.3    Cardiac Enzymes No results for input(s): TROPONINI, PROBNP in the last 168 hours.  Glucose No results for input(s): GLUCAP in the last 168 hours.  Imaging Ct Head Wo Contrast  Result Date: 08/27/2016 CLINICAL DATA:  Follow-up known intracranial hemorrhage. Subsequent encounter. EXAM: CT HEAD WITHOUT CONTRAST TECHNIQUE: Contiguous axial images were obtained from the base of the skull through the vertex without intravenous contrast. COMPARISON:  CT of the head performed earlier today at 4:23 p.m. FINDINGS: Brain: The patient's large acute left parietal intracranial hemorrhage has increased in size, now measuring 7.6 x 7.1 cm, with nearly 1 cm of  rightward midline shift. Blood tracks adjacent to the pons, with associated edema and mass effect. There is suggestion of mild transtentorial herniation. A large amount of blood is seen filling the lateral ventricles and third ventricle, and extending to the fourth ventricle, increased from the prior study. There is dilatation of the lateral ventricles, reflecting underlying hydrocephalus. Diffuse periventricular and subcortical white matter change likely reflects small vessel ischemic microangiopathy. Underlying mild to moderate cortical volume loss is noted. Vascular: No hyperdense vessel or unexpected calcification. Skull: There is no evidence of fracture; visualized osseous structures are unremarkable in appearance. Sinuses/Orbits: The visualized portions of the orbits are within normal limits. The paranasal sinuses and mastoid air cells are well-aerated. Other: No significant soft tissue abnormalities are seen. IMPRESSION: 1. Large acute left parietal intracranial hemorrhage has increased in size, now measuring 7.6 x 7.1 cm, with nearly 1 cm rightward midline shift. Blood tracks adjacent to the pons, with associated edema and mass effect. Suggestion of  mild transtentorial herniation. 2. Large amount of blood filling the ventricles, increased from the prior study. Dilatation of the lateral ventricles reflects underlying hydrocephalus, new from the prior study. 3. Diffuse small vessel ischemic microangiopathy and mild to moderate cortical volume loss. Critical Value/emergent results were called by telephone at the time of interpretation on 09/05/2016 at 11:40 pm to Nursing on [id number], who verbally acknowledged these results. Electronically Signed   By: Garald Balding M.D.   On: 09/14/2016 23:46   Dg Chest Port 1 View  Result Date: 09/13/2016 CLINICAL DATA:  Respiratory failure. History of diabetes, hypertension, smoker. EXAM: PORTABLE CHEST 1 VIEW COMPARISON:  Chest radiograph September 22, 2016 at 1900 hours.  FINDINGS: Endotracheal tube tip projects 17 mm above the carina. Nasogastric tube looped in proximal stomach with distal tip projecting at back at GE junction. Cardiomediastinal silhouette is normal, mildly calcified aortic knob. Increased lung volumes, bibasilar strandy densities. No pleural effusion or focal consolidation. No pneumothorax. Soft tissue planes and included osseous structures are unchanged. IMPRESSION: Similarly positioned life-support lines. COPD and bibasilar atelectasis/ scarring. Electronically Signed   By: Elon Alas M.D.   On: 09/07/2016 22:42   Dg Chest Portable 1 View  Result Date: 09/03/2016 CLINICAL DATA:  Status post intubation. EXAM: PORTABLE CHEST 1 VIEW COMPARISON:  None FINDINGS: There is an ET tube with tip above the carina. The nasogastric tube is looped within the stomach. Normal heart size. No pleural effusion or edema. No airspace opacities. IMPRESSION: Satisfactory position of the ET tube which is well situated above the carina. Electronically Signed   By: Kerby Moors M.D.   On: 08/28/2016 19:34   Ct Head Code Stroke W/o Cm  Result Date: 08/29/2016 CLINICAL DATA:  Code stroke. Unwitnessed fall. Found down in bathroom. On Plavix. Aphasia. EXAM: CT HEAD WITHOUT CONTRAST TECHNIQUE: Contiguous axial images were obtained from the base of the skull through the vertex without intravenous contrast. COMPARISON:  06/23/2016 FINDINGS: Brain: There is an acute parenchymal hemorrhage involving the left thalamus and extending laterally into the adjacent deep white matter as well as inferiorly into the left midbrain. The hematoma measures 4.5 x 3.0 x 2.7 cm (estimated volume 18 cc). There is intraventricular extension with small volume blood in the left greater than right lateral ventricles. No significant ventricular dilatation is seen compared to the prior study to indicate acute hydrocephalus. Local rightward midline shift at the level of the thalami measures 7 mm. There is  mild edema surrounding the parenchymal hematoma. Bilateral cerebral white matter hypodensities are nonspecific but compatible with moderate chronic small vessel ischemic disease. There is mild cerebral atrophy. There is no evidence of acute cortically based infarct or extra-axial fluid collection. Vascular: Mild calcified atherosclerosis at the skullbase. No hyperdense vessel. Skull: No fracture or suspicious osseous lesion. Sinuses/Orbits: Mild bilateral ethmoid air cell mucosal thickening. Clear mastoid air cells. Bilateral cataract extraction. Other: None. ASPECTS [facility] Stroke Program Early CT Score): Not scored due to hemorrhage. IMPRESSION: 1. 4.5 cm acute left thalamic hemorrhage extending into the midbrain and lateral ventricles. 2. 7 mm rightward midline shift. 3. Moderate chronic small vessel ischemic disease. Critical Value/emergent results were called by telephone at the time of interpretation on 08/28/2016 at 6:39 pm to Dr. Dayna Barker, who verbally acknowledged these results. Electronically Signed   By: Logan Bores M.D.   On: 08/30/2016 18:40     STUDIES:  Head CT (initial): large ICH on L thalamus.  CULTURES: none  ANTIBIOTICS: none  SIGNIFICANT EVENTS: Intubated  in APED  LINES/TUBES: PIVs ETT OGT Foley  DISCUSSION: 81 y/o woman with ICH  ASSESSMENT / PLAN:  PULMONARY A: Need for mechanical ventilation Emphysema seen on CXR P:   Full vent support Poor prognosis  CARDIOVASCULAR A:  HTN Need for BP control P:  Cardene for BP control  RENAL A:   No active issues P:    GASTROINTESTINAL A:   No active issues P:    HEMATOLOGIC A:   CNS hemorrhage P:  Avoid antiplatelets, heparin products, etc  INFECTIOUS A:   No active issues P:    ENDOCRINE A:   No active issues   P:    NEUROLOGIC A:   Severe ICH P:   Prognosis poor Appreciate neuro / NSGY input Hypertonic saline for cerebral edema  RASS goal: 0   FAMILY  - Updates:  Updated on  admission  - Inter-disciplinary family meet or Palliative Care meeting due by:  09/29/16  CRITICAL CARE Performed by: Luz Brazen   Total critical care time: 45  minutes  Critical care time was exclusive of separately billable procedures and treating other patients.  Critical care was necessary to treat or prevent imminent or life-threatening deterioration.  Critical care was time spent personally by me on the following activities: development of treatment plan with patient and/or surrogate as well as nursing, discussions with consultants, evaluation of patient's response to treatment, examination of patient, obtaining history from patient or surrogate, ordering and performing treatments and interventions, ordering and review of laboratory studies, ordering and review of radiographic studies, pulse oximetry and re-evaluation of patient's condition.  Luz Brazen, MD Pulmonary and [place] Pager: [phone]  09/23/2016, 3:35 AM

## 2016-09-23 NOTE — Progress Notes (Signed)
eLink Physician-Brief Progress Note Patient Name: Shannon Maldonado DOB: Apr 27, 1936 MRN: [id number]   Date of Service  09/23/2016  HPI/Events of Note  Hypokalemia  eICU Interventions  Potassium replaced     Intervention Category Intermediate Interventions: Electrolyte abnormality - evaluation and management  DETERDING,ELIZABETH 09/23/2016, 1:16 AM

## 2016-09-23 NOTE — Progress Notes (Signed)
Pt appears very comfortable, morphine drip at 3, family at bedside.  Oral care done.

## 2016-09-23 NOTE — Progress Notes (Signed)
RT NOTE:   Pt extubated. Comfort care requested by family. 2L [place] for comfort.

## 2016-09-23 NOTE — Progress Notes (Signed)
Pt transferrred from [facility] , family oriented to dept, comfort cart ordered for them.  Pt appears comfortable on morphine drip at 3.

## 2016-09-24 DEATH — deceased

## 2016-10-24 NOTE — Progress Notes (Signed)
Confirming waste of 150cc of morphine drip. Patient had expired.

## 2016-10-24 NOTE — Progress Notes (Signed)
Pt passed away while listening to "[employer]" with her family at bedside.  Barrett Henle, RN confirmed pronouncement.  Dr. Chase Caller notified.  Barrett Henle, RN witnessed waste of 150 cc of morphine drip.

## 2016-10-24 NOTE — Discharge Summary (Signed)
DISCHARGE SUMMARY    Date of admit: 09/21/2016  6:19 PM Date of discharge: September 30, 2016  8:05 AM Length of Stay: 2 days  PCP is Purvis Kilts, MD   PROBLEM LIST Active Problems:   Intracerebral bleed ([place])   [place] (intracerebral hemorrhage) ([place])   Coma ([place])   Terminal care   DNAR (do not attempt resuscitation)    SUMMARY Shannon Maldonado was 81 y.o. patient with    has a past medical history of Arthritis; Chronic lower back pain; Coronary artery disease; Depression; Diverticula of colon; Exertional shortness of breath; Fatty liver; Follow-up examination of abnormal mammogram (08/01/2016); H/O Clostridium difficile infection (2005); High cholesterol; Hypertension; Respiratory arrest ([place]) (11/25/2012); Seizures ([place]); Syncope and collapse (11/25/2012); Tubulovillous adenoma (07/13/2003); and Type II diabetes mellitus ([place]).   has a past surgical history that includes Colonoscopy (07/23/2003); Colonoscopy (09/26/2006); Colonoscopy (01/31/2012); ORIF hip fracture (Right, 04/1989); Cataract extraction w/ intraocular lens  implant, bilateral (Bilateral, 2000's); Abdominal hysterectomy (1980); Appendectomy (1980); and Cardiac catheterization.   Admitted on 09/04/2016 withsudden LOC and was taken to [facility] ED where she was intubated and a head CT showed acute ICH. She was transferred to [facility]. Comfort care measures initiated and patient expired 09-30-16     SIGNED Dr. Brand Males, M.D., [facility].C.P Pulmonary and Critical Care Medicine Staff Physician [place] [facility] Pager: [phone], If no answer or between  15:00h - 7:00h: call [phone]  10/17/2016 3:33 AM

## 2016-10-24 DEATH — deceased
